# Patient Record
Sex: Female | Born: 1935 | Race: White | Hispanic: No | Marital: Married | Smoking: Never smoker
Health system: Southern US, Community
[De-identification: ages and names within clinical notes are randomized; demographics above are authoritative.]

## PROBLEM LIST (undated history)

## (undated) DIAGNOSIS — I219 Acute myocardial infarction, unspecified: Secondary | ICD-10-CM

## (undated) DIAGNOSIS — I10 Essential (primary) hypertension: Secondary | ICD-10-CM

## (undated) DIAGNOSIS — E785 Hyperlipidemia, unspecified: Secondary | ICD-10-CM

## (undated) DIAGNOSIS — F0390 Unspecified dementia without behavioral disturbance: Secondary | ICD-10-CM

## (undated) DIAGNOSIS — E079 Disorder of thyroid, unspecified: Secondary | ICD-10-CM

## (undated) HISTORY — DX: Acute myocardial infarction, unspecified: I21.9

## (undated) HISTORY — DX: Essential (primary) hypertension: I10

## (undated) HISTORY — DX: Disorder of thyroid, unspecified: E07.9

## (undated) HISTORY — DX: Unspecified dementia, unspecified severity, without behavioral disturbance, psychotic disturbance, mood disturbance, and anxiety: F03.90

## (undated) HISTORY — PX: NO PAST SURGERIES: SHX2092

## (undated) HISTORY — DX: Hyperlipidemia, unspecified: E78.5

---

## 1997-11-26 ENCOUNTER — Other Ambulatory Visit: Admission: RE | Admit: 1997-11-26 | Discharge: 1997-11-26 | Payer: Self-pay | Admitting: *Deleted

## 1998-02-09 ENCOUNTER — Other Ambulatory Visit: Admission: RE | Admit: 1998-02-09 | Discharge: 1998-02-09 | Payer: Self-pay | Admitting: Cardiology

## 1999-04-07 ENCOUNTER — Emergency Department (HOSPITAL_COMMUNITY): Admission: EM | Admit: 1999-04-07 | Discharge: 1999-04-07 | Payer: Self-pay | Admitting: Emergency Medicine

## 1999-11-19 ENCOUNTER — Encounter: Payer: Self-pay | Admitting: Cardiology

## 1999-11-19 ENCOUNTER — Encounter: Admission: RE | Admit: 1999-11-19 | Discharge: 1999-11-19 | Payer: Self-pay | Admitting: Cardiology

## 2000-03-30 ENCOUNTER — Ambulatory Visit (HOSPITAL_COMMUNITY): Admission: RE | Admit: 2000-03-30 | Discharge: 2000-03-30 | Payer: Self-pay | Admitting: *Deleted

## 2000-11-30 ENCOUNTER — Encounter: Admission: RE | Admit: 2000-11-30 | Discharge: 2000-11-30 | Payer: Self-pay | Admitting: Cardiology

## 2000-11-30 ENCOUNTER — Encounter: Payer: Self-pay | Admitting: Cardiology

## 2000-12-05 ENCOUNTER — Encounter: Admission: RE | Admit: 2000-12-05 | Discharge: 2000-12-05 | Payer: Self-pay | Admitting: Cardiology

## 2000-12-05 ENCOUNTER — Encounter: Payer: Self-pay | Admitting: Cardiology

## 2001-11-15 ENCOUNTER — Encounter: Admission: RE | Admit: 2001-11-15 | Discharge: 2001-11-15 | Payer: Self-pay | Admitting: Cardiology

## 2001-11-15 ENCOUNTER — Encounter: Payer: Self-pay | Admitting: Cardiology

## 2003-01-31 ENCOUNTER — Encounter: Admission: RE | Admit: 2003-01-31 | Discharge: 2003-01-31 | Payer: Self-pay | Admitting: Cardiology

## 2003-01-31 ENCOUNTER — Encounter: Payer: Self-pay | Admitting: Cardiology

## 2003-08-10 ENCOUNTER — Emergency Department (HOSPITAL_COMMUNITY): Admission: EM | Admit: 2003-08-10 | Discharge: 2003-08-11 | Payer: Self-pay | Admitting: Emergency Medicine

## 2004-04-23 ENCOUNTER — Encounter: Admission: RE | Admit: 2004-04-23 | Discharge: 2004-04-23 | Payer: Self-pay | Admitting: Cardiology

## 2005-06-03 ENCOUNTER — Encounter: Admission: RE | Admit: 2005-06-03 | Discharge: 2005-06-03 | Payer: Self-pay | Admitting: Cardiology

## 2005-06-14 ENCOUNTER — Encounter: Admission: RE | Admit: 2005-06-14 | Discharge: 2005-06-14 | Payer: Self-pay | Admitting: Cardiology

## 2006-03-13 ENCOUNTER — Inpatient Hospital Stay (HOSPITAL_COMMUNITY): Admission: EM | Admit: 2006-03-13 | Discharge: 2006-03-16 | Payer: Self-pay | Admitting: Emergency Medicine

## 2006-03-30 ENCOUNTER — Encounter: Admission: RE | Admit: 2006-03-30 | Discharge: 2006-03-30 | Payer: Self-pay | Admitting: Cardiology

## 2006-05-26 ENCOUNTER — Encounter: Admission: RE | Admit: 2006-05-26 | Discharge: 2006-05-26 | Payer: Self-pay | Admitting: Cardiology

## 2007-06-22 ENCOUNTER — Encounter: Admission: RE | Admit: 2007-06-22 | Discharge: 2007-06-22 | Payer: Self-pay | Admitting: Cardiology

## 2007-12-03 IMAGING — CR DG CHEST 2V
2 series · 2 of 2 positions shown · non-contrast
Comparison: none

CLINICAL DATA: Chest pain, history of hypertension. 
 CHEST - 2 VIEW:

[view not recorded (1 of 2)]
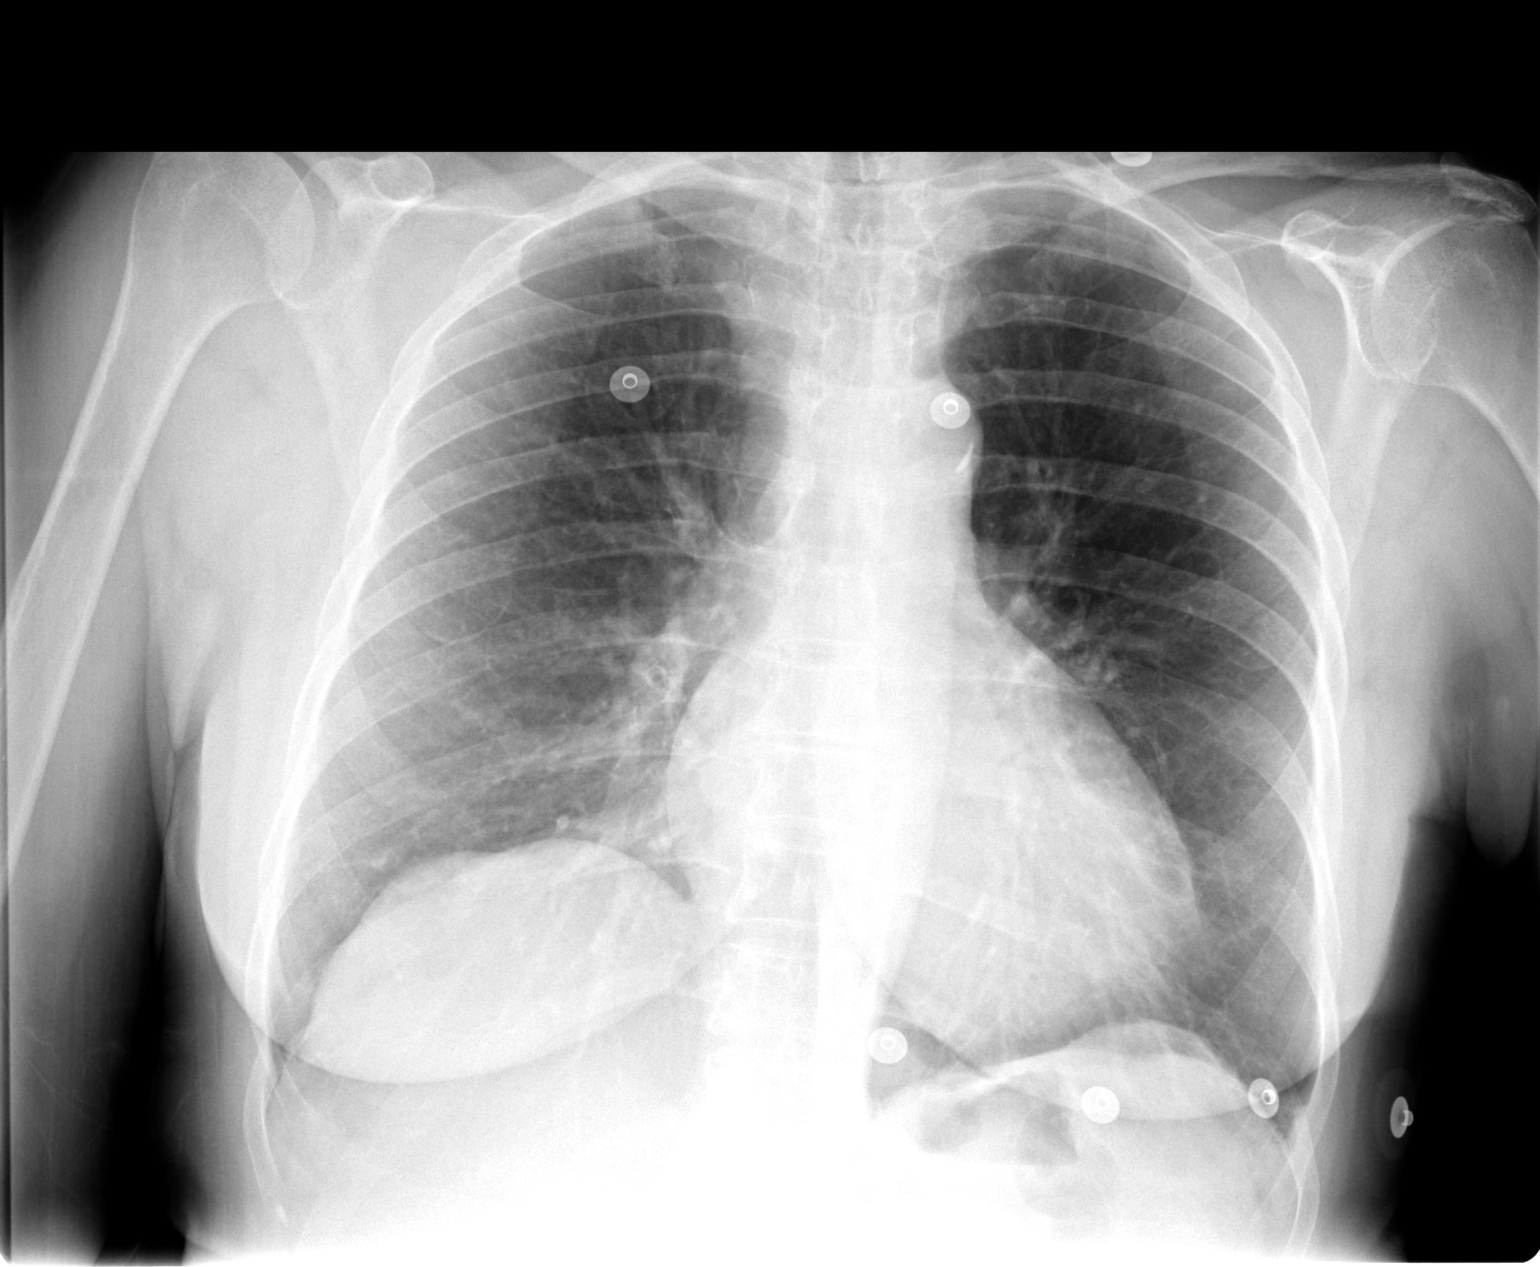

[view not recorded (2 of 2)]
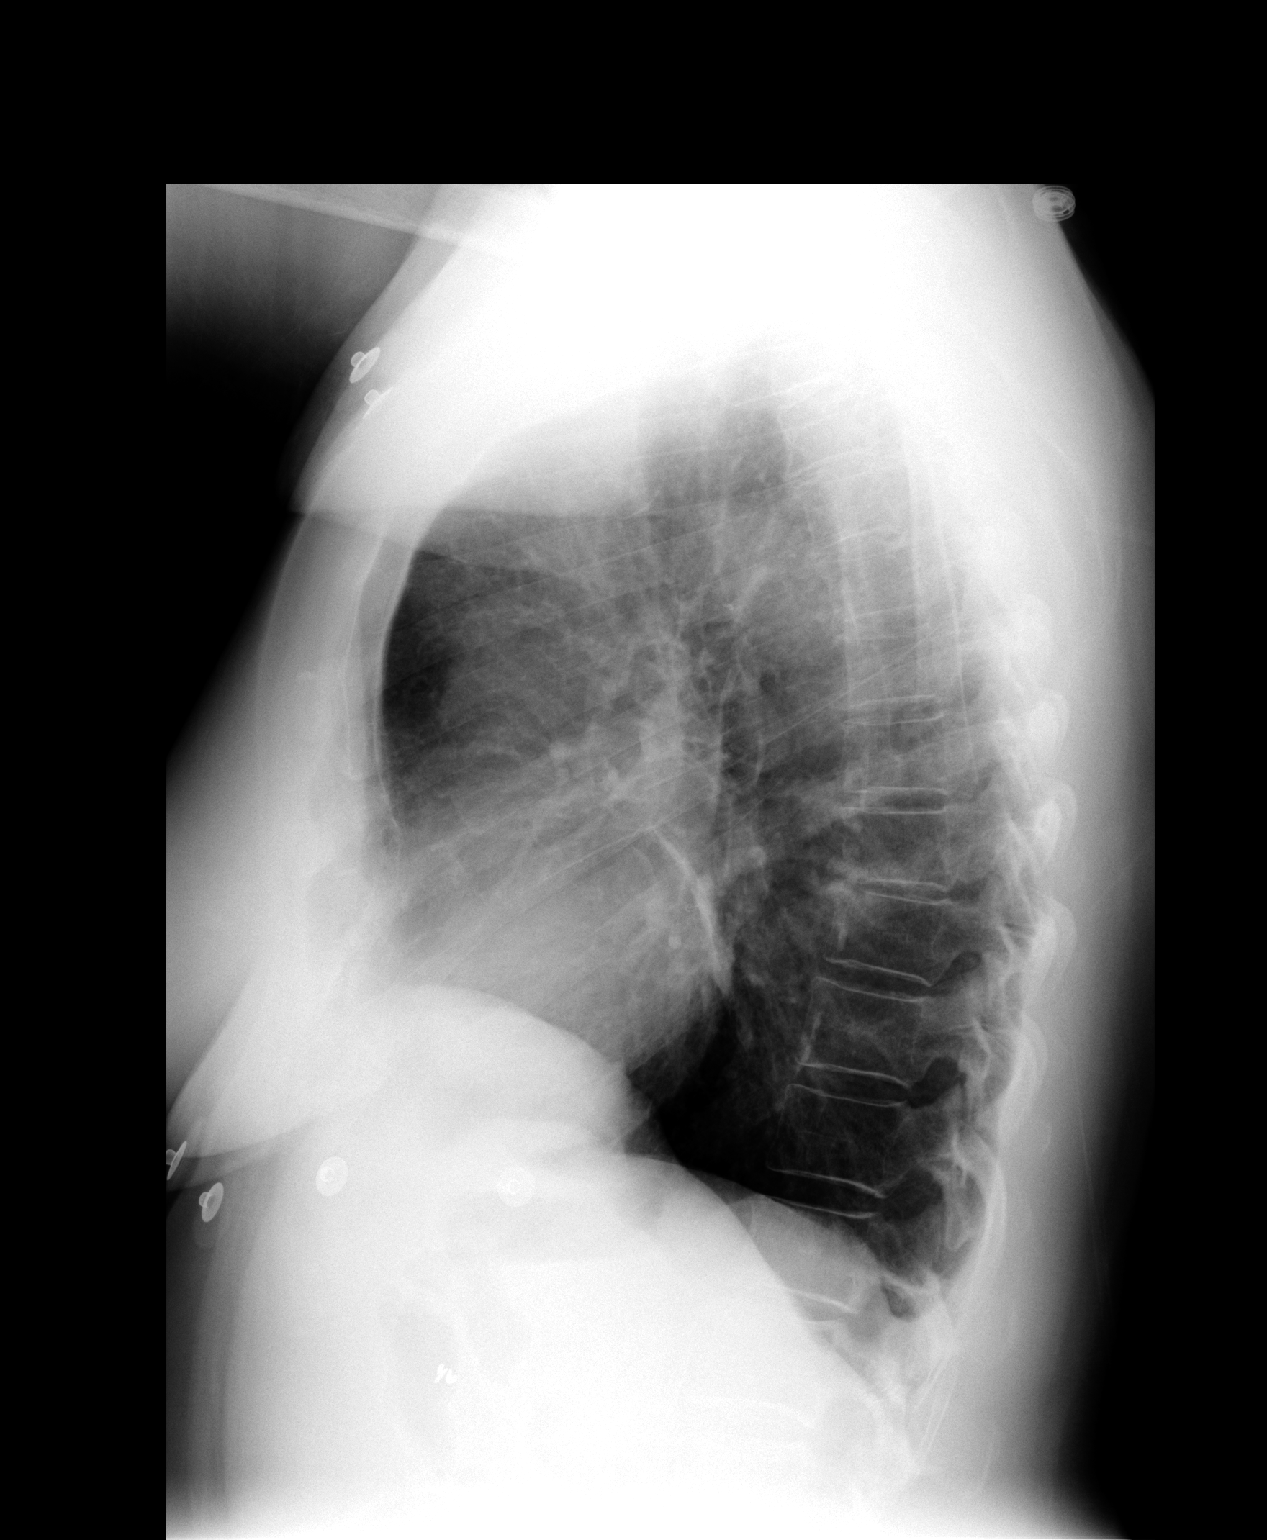

[2 of 2 positions shown; findings below may reference images not displayed]

FINDINGS: Pulmonary hyperaeration.  No active infiltrate/atelectasis.  Pectus excavatum.  Cardiac size towards the upper limits of normal.   No vascular congestion.
IMPRESSION: Pulmonary hyperaeration ? suspicion for COPD.   This could represent acute hyperaeration as this may be seen in patients with asthma.

## 2008-06-23 ENCOUNTER — Encounter: Admission: RE | Admit: 2008-06-23 | Discharge: 2008-06-23 | Payer: Self-pay | Admitting: Cardiology

## 2008-07-03 ENCOUNTER — Encounter: Admission: RE | Admit: 2008-07-03 | Discharge: 2008-07-03 | Payer: Self-pay | Admitting: Cardiology

## 2008-12-15 ENCOUNTER — Encounter: Payer: Self-pay | Admitting: Family Medicine

## 2008-12-15 ENCOUNTER — Encounter: Payer: Self-pay | Admitting: Cardiology

## 2009-09-30 ENCOUNTER — Encounter: Payer: Self-pay | Admitting: Family Medicine

## 2009-12-31 ENCOUNTER — Encounter: Payer: Self-pay | Admitting: Family Medicine

## 2009-12-31 LAB — CONVERTED CEMR LAB
Alkaline Phosphatase: 46 units/L
BUN: 9 mg/dL
Chloride, Serum: 97 mmol/L
Cholesterol: 167 mg/dL
Creatinine, Ser: 0.95 mg/dL
Globulin: 2.6 g/dL
Glucose, Bld: 111 mg/dL
HDL: 59 mg/dL
LDL Cholesterol: 83 mg/dL

## 2010-03-31 ENCOUNTER — Telehealth (INDEPENDENT_AMBULATORY_CARE_PROVIDER_SITE_OTHER): Payer: Self-pay | Admitting: *Deleted

## 2010-04-06 ENCOUNTER — Telehealth (INDEPENDENT_AMBULATORY_CARE_PROVIDER_SITE_OTHER): Payer: Self-pay | Admitting: *Deleted

## 2010-04-09 ENCOUNTER — Encounter (INDEPENDENT_AMBULATORY_CARE_PROVIDER_SITE_OTHER): Payer: Self-pay | Admitting: *Deleted

## 2010-04-09 DIAGNOSIS — E039 Hypothyroidism, unspecified: Secondary | ICD-10-CM | POA: Insufficient documentation

## 2010-04-09 DIAGNOSIS — I10 Essential (primary) hypertension: Secondary | ICD-10-CM | POA: Insufficient documentation

## 2010-04-09 DIAGNOSIS — E785 Hyperlipidemia, unspecified: Secondary | ICD-10-CM

## 2010-04-12 ENCOUNTER — Ambulatory Visit: Payer: Self-pay | Admitting: Family Medicine

## 2010-04-12 DIAGNOSIS — R5383 Other fatigue: Secondary | ICD-10-CM

## 2010-04-12 DIAGNOSIS — D485 Neoplasm of uncertain behavior of skin: Secondary | ICD-10-CM

## 2010-04-12 DIAGNOSIS — R413 Other amnesia: Secondary | ICD-10-CM

## 2010-04-12 DIAGNOSIS — R011 Cardiac murmur, unspecified: Secondary | ICD-10-CM

## 2010-04-12 DIAGNOSIS — R5381 Other malaise: Secondary | ICD-10-CM

## 2010-04-12 DIAGNOSIS — H9190 Unspecified hearing loss, unspecified ear: Secondary | ICD-10-CM | POA: Insufficient documentation

## 2010-04-19 ENCOUNTER — Encounter: Payer: Self-pay | Admitting: Family Medicine

## 2010-04-21 LAB — CONVERTED CEMR LAB
AST: 29 units/L (ref 0–37)
Albumin: 4.2 g/dL (ref 3.5–5.2)
Alkaline Phosphatase: 42 units/L (ref 39–117)
BUN: 8 mg/dL (ref 6–23)
Bilirubin, Direct: 0.3 mg/dL (ref 0.0–0.3)
CO2: 34 meq/L — ABNORMAL HIGH (ref 19–32)
Chloride: 95 meq/L — ABNORMAL LOW (ref 96–112)
Cholesterol: 193 mg/dL (ref 0–200)
Creatinine, Ser: 0.9 mg/dL (ref 0.4–1.2)
Eosinophils Absolute: 0.1 10*3/uL (ref 0.0–0.7)
Glucose, Bld: 102 mg/dL — ABNORMAL HIGH (ref 70–99)
MCHC: 33.6 g/dL (ref 30.0–36.0)
MCV: 96.3 fL (ref 78.0–100.0)
Monocytes Absolute: 0.3 10*3/uL (ref 0.1–1.0)
Neutrophils Relative %: 56.9 % (ref 43.0–77.0)
Platelets: 159 10*3/uL (ref 150.0–400.0)
Total CHOL/HDL Ratio: 4
Total Protein: 6.7 g/dL (ref 6.0–8.3)
Triglycerides: 110 mg/dL (ref 0.0–149.0)

## 2010-04-26 ENCOUNTER — Ambulatory Visit: Payer: Self-pay | Admitting: Internal Medicine

## 2010-04-26 ENCOUNTER — Ambulatory Visit (HOSPITAL_COMMUNITY): Admission: RE | Admit: 2010-04-26 | Discharge: 2010-04-26 | Payer: Self-pay | Admitting: Family Medicine

## 2010-04-26 ENCOUNTER — Ambulatory Visit: Payer: Self-pay

## 2010-04-26 ENCOUNTER — Encounter: Payer: Self-pay | Admitting: Family Medicine

## 2010-04-26 ENCOUNTER — Telehealth: Payer: Self-pay | Admitting: Family Medicine

## 2010-05-24 ENCOUNTER — Ambulatory Visit: Payer: Self-pay | Admitting: Family Medicine

## 2010-06-01 ENCOUNTER — Telehealth: Payer: Self-pay | Admitting: Family Medicine

## 2010-06-07 ENCOUNTER — Telehealth: Payer: Self-pay | Admitting: Family Medicine

## 2010-06-10 DIAGNOSIS — E871 Hypo-osmolality and hyponatremia: Secondary | ICD-10-CM | POA: Insufficient documentation

## 2010-06-10 DIAGNOSIS — R079 Chest pain, unspecified: Secondary | ICD-10-CM

## 2010-06-10 DIAGNOSIS — Z8659 Personal history of other mental and behavioral disorders: Secondary | ICD-10-CM

## 2010-06-10 DIAGNOSIS — E876 Hypokalemia: Secondary | ICD-10-CM

## 2010-06-11 ENCOUNTER — Ambulatory Visit: Payer: Self-pay | Admitting: Cardiology

## 2010-06-11 DIAGNOSIS — R9389 Abnormal findings on diagnostic imaging of other specified body structures: Secondary | ICD-10-CM | POA: Insufficient documentation

## 2010-08-03 ENCOUNTER — Telehealth (INDEPENDENT_AMBULATORY_CARE_PROVIDER_SITE_OTHER): Payer: Self-pay | Admitting: *Deleted

## 2010-08-31 NOTE — Miscellaneous (Signed)
Summary: [facility] 12-31-09  Clinical Lists Changes  Observations: Added new observation of TRIGLYC TOT: 127 mg/dL (64/40/3474 2:59) Added new observation of LDL: 83 mg/dL (56/38/7564 3:32) Added new observation of HDL: 59 mg/dL (95/18/8416 6:06) Added new observation of CHOLESTEROL: 167 mg/dL (30/16/0109 3:23) Added new observation of BILI TOTAL: 0.6 mg/dL (55/73/2202 5:42) Added new observation of ALK PHOS: 46 units/L (12/31/2009 9:16) Added new observation of SGPT (ALT): 15 units/L (12/31/2009 9:16) Added new observation of SGOT (AST): 23 units/L (12/31/2009 9:16) Added new observation of PROTEIN, TOT: 6.6 g/dL (70/62/3762 8:31) Added new observation of GLOBULIN TOT: 2.6 g/dL (51/76/1607 3:71) Added new observation of ALBUMIN: 4.0 g/dL (01/25/9484 4:62) Added new observation of CALCIUM: 9.1 mg/dL (70/35/0093 8:18) Added new observation of GLUCOSE SER: 111 mg/dL (29/93/7169 6:78) Added new observation of CREATININE: 0.95 mg/dL (93/81/0175 1:02) Added new observation of BUN: 9 mg/dL (58/52/7782 4:23) Added new observation of CO2 TOTAL: 26 mmol/L (12/31/2009 9:16) Added new observation of CHLORIDE: 97 mmol/L (12/31/2009 9:16) Added new observation of POTASSIUM: 3.8 mmol/L (12/31/2009 9:16) Added new observation of SODIUM: 138 mmol/L (12/31/2009 9:16)

## 2010-08-31 NOTE — Assessment & Plan Note (Signed)
Summary: np6/murmur/jml   CC:  dizziness.  History of Present Illness: 77 year old female for evaluation of murmur. Cardiac catheterization in August of 2007 revealed hyperdynamic LV function; ostial 20-30 Lcx and 30 RCA. echocardiogram in September of 2011 revealed  hyperdynamic LV function with near cavity obliteration.There is dynamic obstruction through the LV. With valsalva the gradient through the LV is 100 mm Hg. Wall thickness was increased in a pattern of moderate LVH. Doppler parameters are consistent with abnormal left ventricular relaxation (grade 1 diastolic dysfunction). The aortic valve is thckened, calcified with diastolic doming. Question if it is functionally bicuspid. Given hemodynamics in LV cannot accurately determine gradient through valve. Overall appears to be moderately restricted in its motion. Mild regurgitation. Patient denies dyspnea on exertion, orthopnea, PND, pedal edema, palpitations, syncope or chest pain. Note she is not very active as she apparently has significant dementia and memory problems.  \  Current Medications (verified): 1)  Lisinopril 20 Mg Tabs (Lisinopril) .... Take One Tablet Two Times A Day 2)  Synthroid 75 Mcg Tabs (Levothyroxine Sodium) .... Take One Tablet Daily-Brand Name 3)  Temazepam 30 Mg Caps (Temazepam) .... Take One Tablet At Bedtime 4)  Aspirin 81 Mg Tbec (Aspirin) .... Take 2 Tab in Am and 2 Tab in Pm 5)  Metoprolol Tartrate 50 Mg Tabs (Metoprolol Tartrate) .... Take Two Times A Day 6)  Oscal 500/200 D-3 500-200 Mg-Unit Tabs (Calcium-Vitamin D) .... Take 1 Tab in Am and 1 Tab in Pm 7)  Hydrochlorothiazide 12.5 Mg Caps (Hydrochlorothiazide) .Marland Kitchen.. 1 By Mouth Once Daily For Blood Pressure 8)  Zoloft 50 Mg Tabs (Sertraline Hcl) .... 0.5 Tab By Mouth Once Daily For 2 Weeks Then Increase To 1 By Mouth Qd  Allergies: No Known Drug Allergies  Past History:  Past Medical History: HYPERTENSION HYPERLIPIDEMIA DEMENTIA, HX OF NEOPLASM,  SKIN, UNCERTAIN BEHAVIOR  UNSPECIFIED HEARING LOSS HYPOTHYROIDISM   Past Surgical History: Reviewed history from 04/12/2010 and no changes required. unknown  Family History: breast CA-sister No premature CAD to patient's knowledge  Social History: Reviewed history from 04/12/2010 and no changes required. Married Never Smoked Alcohol use-no Drug use-no Regular exercise-no  Review of Systems       no fevers or chills, productive cough, hemoptysis, dysphasia, odynophagia, melena, hematochezia, dysuria, hematuria, rash, seizure activity, orthopnea, PND, pedal edema, claudication. Remaining systems are negative.   Vital Signs:  Patient profile:   77 year old female Height:      63 inches Weight:      144 pounds BMI:     25.60 Pulse rate:   88 / minute Resp:     16 per minute BP sitting:   140 / 80  (right arm)  Vitals Entered By: Kem Parkinson (June 11, 2010 12:05 PM)  Physical Exam  General:  Well developed/well nourished in NAD Skin warm/dry Patient not depressed No peripheral clubbing Back-normal HEENT-normal/normal eyelids Neck supple/normal carotid upstroke bilaterally; no bruits; no JVD; no thyromegaly chest - CTA/ normal expansion CV - RRR/normal S1 and S2; no murmurs, rubs or gallops;  PMI nondisplaced Abdomen -NT/ND, no HSM, no mass, + bowel sounds, no bruit 2+ femoral pulses, no bruits Ext-no edema, chords, 2+ DP Neuro-grossly nonfocal     EKG  Procedure date:  06/11/2010  Findings:      Sinus rhythm at a rate of 87. Left anterior fasicular block. Anterior and inferior infarct.  Impression & Recommendations:  Problem # 1:  HEART MURMUR, SYSTOLIC (ICD-785.2) Patient is noted to have  an LVOT gradient as well as possible aortic stenosis. However she is not having symptoms of dyspnea, chest pain or syncope. She also appears to have significant dementia which is worsening her her daughter. I therefore think we should be conservative and her  daughter is in agreement. We will continue with her beta blocker. I've asked her to contact us if she develops any of the above symptoms. Her updated medication list for this problem includes:    Lisinopril 20 Mg Tabs (Lisinopril) .Marland Kitchen... Take one tablet two times a day    Metoprolol Tartrate 50 Mg Tabs (Metoprolol tartrate) .Marland Kitchen... Take two times a day    Hydrochlorothiazide 12.5 Mg Caps (Hydrochlorothiazide) .Marland Kitchen... 1 by mouth once daily for blood pressure  Problem # 2:  ECHOCARDIOGRAM, ABNORMAL (ICD-793.2) As per #1.  Problem # 3:  HYPERTENSION (ICD-401.9) Blood pressure controlled. Continue present medications. Her updated medication list for this problem includes:    Lisinopril 20 Mg Tabs (Lisinopril) .Marland Kitchen... Take one tablet two times a day    Aspirin 81 Mg Tbec (Aspirin) .Marland Kitchen... Take 2 tab in am and 2 tab in pm    Metoprolol Tartrate 50 Mg Tabs (Metoprolol tartrate) .Marland Kitchen... Take two times a day    Hydrochlorothiazide 12.5 Mg Caps (Hydrochlorothiazide) .Marland Kitchen... 1 by mouth once daily for blood pressure  Problem # 4:  HYPERLIPIDEMIA (ICD-272.4) Management per primary care.  Problem # 5:  DEMENTIA, HX OF (ICD-V11.8)  Problem # 6:  HYPOTHYROIDISM (ICD-244.9)  Her updated medication list for this problem includes:    Synthroid 75 Mcg Tabs (Levothyroxine sodium) .Marland Kitchen... Take one tablet daily-brand name  Patient Instructions: 1)  Your physician wants you to follow-up in:6 MONTHS   You will receive a reminder letter in the mail two months in advance. If you don't receive a letter, please call our office to schedule the follow-up appointment. Prescriptions: METOPROLOL TARTRATE 50 MG TABS (METOPROLOL TARTRATE) take two times a day  #180 x 3   Entered by:   Kem Parkinson   Authorized by:   Ferman Hamming, MD, [facility]   Signed by:   Kem Parkinson on 06/11/2010   Method used:   [facility] [phone][facility] (retail[facility]       [address]       [place], [place]  [address]        Ph: [phone] or [phone]       Fax: [phone]   RxID:   [id number] METOPROLOL TARTRATE 50 MG TABS (METOPROLOL TARTRATE) take two times a day  #60 x 12   Entered by:   Kem Parkinson   Authorized by:   Ferman Hamming, MD, [facility]   Signed by:   Kem Parkinson on 06/11/2010   Method used:   [facility] to        [facility] [phone]* (retail)       [address]       [place], [place]  [address]       Ph: [phone] or [phone]       Fax: [phone]   RxID:   [id number]

## 2010-08-31 NOTE — Progress Notes (Signed)
Summary: FYI from Daughter  Phone Note Call from Patient Call back at [phone]   Caller: Daughter  ~ Cordelia Pen Reason for Call: Talk to Doctor Summary of Call: Patient's daughter called and wanted to leave some information to be reviewed before mom comes in for her appt. Patient has undiagnosed dementia per daughter. She has not been herself recently, she is very nasty towards her husband and seems to sleep all the time. She is very secretive and claims it is all old age. Normally she likes for one of her daughters to come to appts with her, but know wants to be by herself. Patient would like to be present during the new patient appt.  Initial call taken by: Harold Barban,  April 06, 2010 10:56 AM  Follow-up for Phone Call        will need family present to obtain hx and other necessary information. Follow-up by: Neena Rhymes MD,  April 06, 2010 11:18 AM  Additional Follow-up for Phone Call Additional follow up Details #1::        Patient's daughter is aware.  Additional Follow-up by: Harold Barban,  April 06, 2010 11:21 AM

## 2010-08-31 NOTE — Progress Notes (Signed)
Summary: med change  Phone Note Refill Request Call back at [phone] Message from:  daughter karen  Refills Requested: Medication #1:  SYNTHROID 75 MCG TABS take one tablet daily- please change to generic at family's request. Pt daughter states that pt is not taking med since it has been changed to generic. Pt is unable to understand that med are the same. Pt daughter is request that med be changed back to brand name. [facility] [address]  Pt daughter would like to know if you know of any group or program that the family can attend to better help them understand their mother condition. Pls advise...........Marland KitchenFelecia Deloach CMA  June 07, 2010 8:08 AM   Caller: Daughter  Follow-up for Phone Call        please switch back to brand name synthroid.  will forward message to our referral coordinator in hopes that she knows of any alzheimer's resources for family.  would recommend pt try Hospice and see if they have a dementia support group. Follow-up by: Neena Rhymes MD,  June 07, 2010 8:40 AM  Additional Follow-up for Phone Call Additional follow up Details #1::        Pt daughter aware..........Marland KitchenFelecia Deloach CMA  June 07, 2010 8:57 AM     Additional Follow-up for Phone Call Additional follow up Details #2::    I CALLED SPOKE WITH PATIENT'S DAUGHTER PROVIDED HER INFO TO CALL & SPEAK WITH [facility] [address] [place], PH [phone], WHO MEETS W/FAMILIES & OFFERS COUNSELING & INFO ON PATIENT'S CONDITION.  ALSO PROVIDED HER WITH INFO ON A SEMINAR BEING HELD THIS WEEKEND AT [facility] ABOUT PATIENT'S CONDITION, PH [phone], OR WEBSITE OF [email or web address].  DAUGHTER WAS VERY THANKFUL FOR INFORMATION. Follow-up by: Magdalen Spatz [facility],  June 09, 2010 3:59 PM  Additional Follow-up for Phone Call Additional follow up Details #3:: Details for Additional Follow-up Action Taken: thank you! Additional Follow-up by: Neena Rhymes MD,  June 09, 2010  4:08 PM  New/Updated Medications: SYNTHROID 75 MCG TABS (LEVOTHYROXINE SODIUM) take one tablet daily-BRAND NAME [BMN] Prescriptions: SYNTHROID 75 MCG TABS (LEVOTHYROXINE SODIUM) take one tablet daily-BRAND NAME Brand medically necessary #30 x 4   Entered by:   Jeremy Johann CMA   Authorized by:   Neena Rhymes MD   Signed by:   Jeremy Johann CMA on 06/07/2010   Method used:   Faxed to ...       [facility] [address] (retail)       [address]       [place], [place]  [address]       Ph: [phone] or [phone]       Fax: [phone]   RxID:   [id number]

## 2010-08-31 NOTE — Progress Notes (Signed)
Summary: Refill--HCTZ/synthroid  Phone Note Refill Request Message from:  Fax from Pharmacy on April 26, 2010 9:15 AM  Refills Requested: Medication #1:  HYDROCHLOROTHIAZIDE 12.5 MG CP EVERY DAY FOR BLOOD PRESSURE [facility] [id number]  Initial call taken by: Okey Regal [place],  April 26, 2010 9:17 AM  Follow-up for Phone Call        Per patient daughter, Roanna Raider, she was seeing Dr. Patty Sermons and he had been giving this med. She is aware I will ask Dr. Beverely Low when she returns. Does the patient still need this med, please advise.  Daughter also notes that the patient has possibly misplaced her Synthroid, I made her aware that when pharmacy calls I can authorize early refill. Follow-up by: Lucious Groves CMA,  April 26, 2010 9:34 AM  Additional Follow-up for Phone Call Additional follow up Details #1::        ok to refill HCTZ, #30, 6 refills  ok for synthroid for the same amount Additional Follow-up by: Neena Rhymes MD,  April 28, 2010 9:08 AM    New/Updated Medications: HYDROCHLOROTHIAZIDE 12.5 MG CAPS (HYDROCHLOROTHIAZIDE) 1 by mouth once daily for blood pressure Prescriptions: HYDROCHLOROTHIAZIDE 12.5 MG CAPS (HYDROCHLOROTHIAZIDE) 1 by mouth once daily for blood pressure  #30 x 6   Entered by:   Lucious Groves CMA   Authorized by:   Neena Rhymes MD   Signed by:   Lucious Groves CMA on 04/28/2010   Method used:   [facility]ron[facility] to        [facility] [phone][facility] (retail[facility]       [address]       [place], [place]  [address]       Ph: [phone] or [phone]       Fax: [phone]   RxID:   [id number] SYNTHROID 75 MCG TABS (LEVOTHYROXINE SODIUM) take one tablet daily- please change to generic at family's request.  #30 x 6   Entered by:   Lucious Groves CMA   Authorized by:   Neena Rhymes MD   Signed by:   Lucious Groves CMA on 04/28/2010   Method used:   [facility]ronically to        [facility] [phone]* (retail)       [address]       [place], [place]   [address]       Ph: [phone] or [phone]       Fax: [phone]   RxID:   [id number]

## 2010-08-31 NOTE — Assessment & Plan Note (Signed)
Summary: rto 1 month/cbs     Vital Signs:  Patient profile:   77 year old female Height:      63 inches Weight:      146 pounds BMI:     25.96 Pulse rate:   84 / minute BP sitting:   124 / 78  (left arm)  Vitals Entered By: Doristine Devoid CMA (May 24, 2010 2:11 PM) CC: f/u- fatigue and sleeps alot    History of Present Illness: 77 yo woman here today for f/u on   1) Fatigue- daughter reports pt is tired all the time.  reports today 'i just don't feel right'.  denies dizziness.  'nothing hurts me or anything'.  'i sleep pretty good'.  will sleep through the night until 10 or 11am.  will also take naps during the day.  will go to bed around 10pm.  daughter feels temazepam may be cause of excessive sleepiness.  pt starts arguing w/ daughter that she isn't sleepy during the day and that she never complains of this.  daughter indicates mom complains daily of fatigue.  2) memory loss- not interested in w/u at this time.  'i don't have any problem'.  family disappointed in this but doesn't want to force the issue.  Current Medications (verified): 1)  Lisinopril 20 Mg Tabs (Lisinopril) .... Take One Tablet Two Times A Day 2)  Synthroid 75 Mcg Tabs (Levothyroxine Sodium) .... Take One Tablet Daily- Please Change To Generic At [facility] Request. 3)  Temazepam 30 Mg Caps (Temazepam) .... Take One Tablet At Bedtime 4)  Aspirin 81 Mg Tbec (Aspirin) .... Take 2 Tab in Am and 2 Tab in Pm 5)  Metoprolol Tartrate 50 Mg Tabs (Metoprolol Tartrate) .... Take Two Times A Day 6)  Oscal 500/200 D-3 500-200 Mg-Unit Tabs (Calcium-Vitamin D) .... Take 1 Tab in Am and 1 Tab in Pm 7)  Hydrochlorothiazide 12.5 Mg Caps (Hydrochlorothiazide) .Marland Kitchen.. 1 By Mouth Once Daily For Blood Pressure 8)  Temazepam 15 Mg Caps (Temazepam) .Marland Kitchen.. 1 Tab By Mouth Nightly For Sleep.  Allergies (verified): No Known Drug Allergies  Review of Systems      See HPI  Physical Exam  General:  Well-developed,well-nourished,in no  acute distress; alert,appropriate and cooperative throughout examination Lungs:  Normal respiratory effort, chest expands symmetrically. Lungs are clear to auscultation, no crackles or wheezes. Heart:  II-III/VI SEM, reg S1/S2 Pulses:  +2 carotid, radial, DP Extremities:  no cyanosis/edema, non pitting edema of LEs Psych:  pt would contradict herself in the same sentence.  argumentative w/ daughter- they can't agree on sxs.   Impression & Recommendations:  Problem # 1:  FATIGUE (ICD-780.79) Assessment Unchanged labs from last visit normal.  daughter reports sxs persist.  pt disagrees.  will decrease temazepam to 15 mg and see if pt is less sleepy.  pt fears decreasing meds- concerned she won't be able to sleep.  after long discussion, pt willing to try this for 1 month.  fatigue may be related to pt's apparent dementia.  will follow closely.  Problem # 2:  MEMORY LOSS (ICD-780.93) Assessment: Unchanged family still desires pt to have a work up but pt refuses at this time and family doesn't want to force the issue.  pt obviously having memory issues as she is contradicting herself in the office and is arguing w/ daughter about her sxs and complaints.  will follow and assist as pt and family allow.  Complete Medication List: 1)  Lisinopril 20 Mg Tabs (  Lisinopril) .... Take one tablet two times a day 2)  Synthroid 75 Mcg Tabs (Levothyroxine sodium) .... Take one tablet daily- please change to generic at family's request. 3)  Temazepam 30 Mg Caps (Temazepam) .... Take one tablet at bedtime 4)  Aspirin 81 Mg Tbec (Aspirin) .... Take 2 tab in am and 2 tab in pm 5)  Metoprolol Tartrate 50 Mg Tabs (Metoprolol tartrate) .... Take two times a day 6)  Oscal 500/200 D-3 500-200 Mg-unit Tabs (Calcium-vitamin d) .... Take 1 tab in am and 1 tab in pm 7)  Hydrochlorothiazide 12.5 Mg Caps (Hydrochlorothiazide) .Marland Kitchen.. 1 by mouth once daily for blood pressure 8)  Temazepam 15 Mg Caps (Temazepam) .Marland Kitchen.. 1 tab by  mouth nightly for sleep.  Other Orders: Flu Vaccine 11yrs + MEDICARE PATIENTS (Z6109) Administration Flu vaccine - MCR (U0454)  Patient Instructions: 1)  Follow up in 1 month to determine if the lower dose of Temazepam is working 2)  I'll look forward to getting the results from your cardiology consultation 3)  Call with any questions or concerns 4)  Have a great week! Prescriptions: TEMAZEPAM 15 MG CAPS (TEMAZEPAM) 1 tab by mouth nightly for sleep.  #30 x 3   Entered and Authorized by:   Neena Rhymes MD   Signed by:   Neena Rhymes MD on 05/24/2010   Method used:   Print then Give to Patient   RxID:   [id number]  Flu Vaccine Consent Questions     Do you have a history of severe allergic reactions to this vaccine? no    Any prior history of allergic reactions to egg and/or gelatin? no    Do you have a sensitivity to the preservative Thimersol? no    Do you have a past history of Guillan-Barre Syndrome? no    Do you currently have an acute febrile illness? no    Have you ever had a severe reaction to latex? no    Vaccine information given and explained to patient? yes    Are you currently pregnant? no    Lot Number:[id number]   Exp Date:01/29/2011   Site Given  Right Deltoid IM nd Authorized by:   Neena Rhymes MD   Signed by:   Neena Rhymes MD on 05/24/2010   Method used:   Print then Give to Patient   RxID:   [id number]  [email or web address]    Orders Added: 1)  Flu Vaccine 45yrs + MEDICARE PATIENTS [Q2039] 2)  Administration Flu vaccine - MCR [G0008] 3)  Est. Patient Level III [41324]

## 2010-08-31 NOTE — Letter (Signed)
Summary: [facility]   Imported By: Lanelle Bal 04/23/2010 13:11:47  _____________________________________________________________________  External Attachment:    Type:   Image     Comment:   External Document

## 2010-08-31 NOTE — Progress Notes (Signed)
Summary: dementia issues  Phone Note Call from Patient Call back at Home Phone [phone]   Caller: Daughter--Sherri Summary of Call: Patient daughter called to find out if there is anything that can be recommended for the patient other than seeing neuro. Possibly a medication or "happy pill"? They are trying to help with pt mood/memory. They would also like recommendations about how to deal with all of this, stating that she is very ugly to her spouse, but not that bad with her daughters "not yet anyway". Please advise. Initial call taken by: Lucious Groves CMA,  June 01, 2010 11:33 AM  Follow-up for Phone Call        can start Zoloft 50mg - 1/2 pill nightly x2 weeks and then increase to 1 pill nightly.  can take 4+ weeks for med to completely take effect.  need f/u appt w/ pt in 1 month to see how meds are working. Follow-up by: Neena Rhymes MD,  June 01, 2010 11:43 AM  Additional Follow-up for Phone Call Additional follow up Details #1::        Patient daughter notified and will just keep patient appt already scheduled for 11/29. Additional Follow-up by: Lucious Groves CMA,  June 01, 2010 11:56 AM    New/Updated Medications: ZOLOFT 50 MG TABS (SERTRALINE HCL) 0.5 tab by mouth once daily for 2 weeks then increase to 1 by mouth qd Prescriptions: ZOLOFT 50 MG TABS (SERTRALINE HCL) 0.5 tab by mouth once daily for 2 weeks then increase to 1 by mouth qd  #30 x 0   Entered by:   Lucious Groves CMA   Authorized by:   Neena Rhymes MD   Signed by:   Lucious Groves CMA on 06/01/2010   Method used:   [facility]        [facility] [phone][facility] (retail)       [address]       [place], [place]  [address]       Ph: [phone] or [phone]       Fax: [phone]   RxID:   [id number]

## 2010-08-31 NOTE — Progress Notes (Signed)
Summary: RX  Phone Note Call from Patient Call back at [phone]   Caller: Daughter Reason for Call: Refill Medication Summary of Call: SHERRY HER DAUGHTER CALL FOR HER MOTHER FOR A REFILL ON LISINPRIL 20 MG [facility] [address] 1 TABLET TWICE A DAY. PT DAUGHTER STATED THAT THE PHARMACIES WAS TO FAX TO [place]. WE NEVER RECIEVE. APPT IS ON THE 9-12. Initial call taken by: Freddy Jaksch,  March 31, 2010 1:40 PM  Follow-up for Phone Call        spoke w/ patient daughter says that they have already requested records transferred which is why she wanted to get refill from our office informed prescription to be called into pharmacy....Marland KitchenMarland KitchenDoristine Devoid CMA  March 31, 2010 3:23 PM     New/Updated Medications: LISINOPRIL 20 MG TABS (LISINOPRIL) take one tablet two times a day Prescriptions: LISINOPRIL 20 MG TABS (LISINOPRIL) take one tablet two times a day  #60 x 0   Entered by:   Doristine Devoid CMA   Authorized by:   Neena Rhymes MD   Signed by:   Doristine Devoid CMA on 03/31/2010   Method used:   Electronically to        [facility] [phone]* (retail)       [address]       [place], [place]  [address]       Ph: [phone] or [phone]       Fax: [phone]   RxID:   [id number]

## 2010-08-31 NOTE — Assessment & Plan Note (Signed)
Summary: new to estab/cbs   Vital Signs:  Patient profile:   77 year old female Height:      63 inches Weight:      144 pounds Temp:     97.9 degrees F oral Pulse rate:   82 / minute Resp:     18 per minute BP sitting:   120 / 70  (left arm)  Vitals Entered By: Jeremy Johann CMA (April 12, 2010 10:12 AM) CC: new to establish, discuss meds, fatigue, forgetful, moody   History of Present Illness: 77 yo woman here today to establish care.  previously saw Dr Patty Sermons.  1) HTN- well controlled today.  On lisinopril, metoprolol, HCTZ.  no CP, SOB, HAs, visual changes, edema.  2) Hyperlipidemia- not on meds.  she reports healthy diet  3) Hypothyroid- on Synthroid.  family would like meds switched to generic.  denies heat/cold intolerance but family reports persistant fatigue (see below)  4) Fatigue- 'i sleep a lot'.  denies sadness.  daughter reports pt has been mean to husband and other family members.  daughter also reports 'she takes too much temazepam'.  daughter reports she gets her meds confused but won't admit to this.  5) Hearing loss- family has concerns, pt does not.  pt having difficult time hearing even loud conversation in exam room.  health maintainence- mammogram last year, thinks she has had colonoscopy in the past but isn't sure if or when.  last pap 'a long time ago'.  Preventive Screening-Counseling & Management  Alcohol-Tobacco     Smoking Status: never  Caffeine-Diet-Exercise     Does Patient Exercise: no      Drug Use:  no.    Current Medications (verified): 1)  Lisinopril 20 Mg Tabs (Lisinopril) .... Take One Tablet Two Times A Day 2)  Synthroid 75 Mcg Tabs (Levothyroxine Sodium) .... Take One Tablet Daily- Please Change To Generic At [facility] Request. 3)  Temazepam 30 Mg Caps (Temazepam) .... Take One Tablet At Bedtime 4)  Aspirin 81 Mg Tbec (Aspirin) .... Take 2 Tab in Am and 2 Tab in Pm 5)  Metoprolol Tartrate 50 Mg Tabs (Metoprolol Tartrate)  .... Take Two Times A Day 6)  Oscal 500/200 D-3 500-200 Mg-Unit Tabs (Calcium-Vitamin D) .... Take 1 Tab in Am and 1 Tab in Pm  Allergies (verified): No Known Drug Allergies  Past History:  Past Surgical History: unknown  Family History: breast CA-sister  Social History: Married Never Smoked Alcohol use-no Drug use-no Regular exercise-no Smoking Status:  never Drug Use:  no Does Patient Exercise:  no  Review of Systems      See HPI  Physical Exam  General:  Well-developed,well-nourished,in no acute distress; alert,appropriate and cooperative throughout examination Head:  NCAT Eyes:  PERRL, EOMI Ears:  hyperpigmented exophytic skin lesion in R ear very hard of hearing Nose:  External nasal examination shows no deformity or inflammation. Nasal mucosa are pink and moist without lesions or exudates. Mouth:  Oral mucosa and oropharynx without lesions or exudates.  Neck:  No deformities, masses, or tenderness noted. Lungs:  Normal respiratory effort, chest expands symmetrically. Lungs are clear to auscultation, no crackles or wheezes. Heart:  II-III/VI SEM, reg S1/S2 Abdomen:  soft, NT/ND, +BS Pulses:  +2 carotid, radial, DP Extremities:  no cyanosis/edema, non pitting edema of LEs Neurologic:  cranial nerves II-XII intact, gait normal, and DTRs symmetrical and normal.   Cervical Nodes:  No lymphadenopathy noted Psych:  normally interactive, good eye contact, not anxious  appearing, and not depressed appearing.     Impression & Recommendations:  Problem # 1:  HYPERTENSION (ICD-401.9) Assessment New BP well controlled.  asymptomatic.  no med changes at this time. The following medications were removed from the medication list:    Lisinopril 20 Mg Tabs (Lisinopril) .Marland Kitchen... Take two times a day Her updated medication list for this problem includes:    Lisinopril 20 Mg Tabs (Lisinopril) .Marland Kitchen... Take one tablet two times a day    Metoprolol Tartrate 50 Mg Tabs (Metoprolol  tartrate) .Marland Kitchen... Take two times a day  Orders: Venipuncture (18841) TLB-BMP (Basic Metabolic Panel-BMET) (80048-METABOL)  Problem # 2:  HYPERLIPIDEMIA (ICD-272.4) Assessment: New not currently on meds, check labs. Orders: TLB-Lipid Panel (80061-LIPID) TLB-Hepatic/Liver Function Pnl (80076-HEPATIC)  Problem # 3:  HYPOTHYROIDISM (ICD-244.9) Assessment: New family would like med switched to generic due to cost.  TSH level may factor into fatigue.  adjust meds as needed. Her updated medication list for this problem includes:    Synthroid 75 Mcg Tabs (Levothyroxine sodium) .Marland Kitchen... Take one tablet daily- please change to generic at family's request.  Orders: Specimen Handling (66063) TLB-TSH (Thyroid Stimulating Hormone) [id number]) Prescription Created Electronically [phone])  Problem # 4:  FATIGUE (ICD-780.79) Assessment: New likely multifactorial- temazepam, ? depression/dementia, hypothyroid.  check labs.  get ECHO to r/o symptomatic murmur.  will follow closely. Orders: Specimen Handling (32202) Echo Referral (Echo) TLB-CBC Platelet - w/Differential (85025-CBCD)  Problem # 5:  UNSPECIFIED HEARING LOSS (ICD-389.9) Assessment: New pt very hard of hearing in exam room today.  family is concerned about this but pt is not interested in eval.  will follow.  Problem # 6:  MEMORY LOSS (ICD-780.93) Assessment: New pt not concerned by this but family is- family feels pt is demented.  discussed neuro evaluation but pt not willing at this time.  daughter would like pt to go but realizes she can't force her.  will do mini-mental status at future visit.  Problem # 7:  HEART MURMUR, SYSTOLIC (ICD-785.2) Assessment: New unclear if this is new for pt or not.  given pt's fatigue will get ECHO to assess. Orders: Echo Referral (Echo)  Problem # 8:  NEOPLASM, SKIN, UNCERTAIN BEHAVIOR (ICD-238.2) Assessment: New pt w/ large exophytic lesion in R ear, refer to derm. Orders: Specimen Handling  (99000) Dermatology Referral (Derma)  Complete Medication List: 1)  Lisinopril 20 Mg Tabs (Lisinopril) .... Take one tablet two times a day 2)  Synthroid 75 Mcg Tabs (Levothyroxine sodium) .... Take one tablet daily- please change to generic at family's request. 3)  Temazepam 30 Mg Caps (Temazepam) .... Take one tablet at bedtime 4)  Aspirin 81 Mg Tbec (Aspirin) .... Take 2 tab in am and 2 tab in pm 5)  Metoprolol Tartrate 50 Mg Tabs (Metoprolol tartrate) .... Take two times a day 6)  Oscal 500/200 D-3 500-200 Mg-unit Tabs (Calcium-vitamin d) .... Take 1 tab in am and 1 tab in pm  Patient Instructions: 1)  Follow up in 1 month, sooner if needed 2)  Please call and let me know about the Neurology (memory) appt 3)  We'll notify you of your lab results 4)  Someone will call you with your dermatology and cardiology appts (for the murmur) 5)  Call with any questions or concerns 6)  Welcome!  We're glad to have you! Prescriptions: SYNTHROID 75 MCG TABS (LEVOTHYROXINE SODIUM) take one tablet daily- please change to generic at family's request.  #30 x 3   Entered and Authorized by:  Neena Rhymes MD   Signed by:   Neena Rhymes MD on 04/12/2010   Method used:   [facility]ron[facility] to        [phone][facility] (retail)       [address]       [place], [place]  [address]       Ph: [phone] or [phone]       Fax: [phone]   RxID:   [id number]

## 2010-08-31 NOTE — Miscellaneous (Signed)
  Clinical Lists Changes  Problems: Added new problem of HYPERLIPIDEMIA (ICD-272.4) Added new problem of HYPERTENSION (ICD-401.9) Added new problem of HYPOTHYROIDISM (ICD-244.9) Medications: Added new medication of SYNTHROID 75 MCG TABS (LEVOTHYROXINE SODIUM) take one tablet daily Added new medication of TEMAZEPAM 30 MG CAPS (TEMAZEPAM) take one tablet at bedtime Added new medication of ASPIRIN 81 MG TBEC (ASPIRIN) take one tablet daily Added new medication of KLOR-CON M20 20 MEQ CR-TABS (POTASSIUM CHLORIDE CRYS CR) take one half tablet two times a day Added new medication of METOPROLOL TARTRATE 50 MG TABS (METOPROLOL TARTRATE) take two times a day Added new medication of LISINOPRIL 20 MG TABS (LISINOPRIL) take two times a day Added new medication of FISH OIL  OIL (FISH OIL) take 1 or 2 tablets daily Observations: Added new observation of PAST MED HX: Hyperlipidemia Hypertension Hypothyroidism  (04/09/2010 8:36) Added new observation of HYPOTHYRDHX: yes (04/09/2010 8:36) Added new observation of HX OF HTN: yes (04/09/2010 8:36) Added new observation of HYPRLIPIDMIA: yes (04/09/2010 8:36)      Past Medical History:    Hyperlipidemia    Hypertension    Hypothyroidism   Appended Document:     Clinical Lists Changes  Observations: Added new observation of TRIGLYC TOT: 127 mg/dL (24/40/1027 2:53) Added new observation of LDL: 83 mg/dL (66/44/0347 4:25) Added new observation of HDL: 59 mg/dL (95/63/8756 4:33) Added new observation of CHOLESTEROL: 167 mg/dL (29/51/8841 6:60) Added new observation of BILI TOTAL: 0.6 mg/dL (63/08/6008 9:32) Added new observation of ALK PHOS: 46 units/L (12/31/2009 9:07) Added new observation of SGPT (ALT): 15 units/L (12/31/2009 9:07) Added new observation of SGOT (AST): 23 units/L (12/31/2009 9:07) Added new observation of PROTEIN, TOT: 6.6 g/dL (35/57/3220 2:54) Added new observation of GLOBULIN TOT: 2.6 g/dL (27/12/2374 2:83) Added new  observation of ALBUMIN: 4.0 g/dL (15/17/6160 7:37) Added new observation of CALCIUM: 9.1 mg/dL (10/62/6948 5:46) Added new observation of GLUCOSE SER: 111 mg/dL (27/09/5007 3:81) Added new observation of CREATININE: 0.95 mg/dL (82/99/3716 9:67) Added new observation of BUN: 9 mg/dL (89/38/1017 5:10) Added new observation of CO2 TOTAL: 26 mmol/L (12/31/2009 9:07) Added new observation of CHLORIDE: 97 mmol/L (12/31/2009 9:07) Added new observation of POTASSIUM: 3.8 mmol/L (12/31/2009 9:07) Added new observation of SODIUM: 138 mmol/L (12/31/2009 9:07) Added new observation of TSH: 2.730 microintl units/mL (09/30/2009 9:07)

## 2010-08-31 NOTE — Miscellaneous (Signed)
Summary: Appointment Canceled  Appointment status changed to canceled by [employer] on 04/16/2010 11:02 AM.  Cancellation Comments --------------------- echo/systolic murmur  Appointment Information ----------------------- Appt Type:  CARDIOLOGY ANCILLARY VISIT      Date:  Tuesday, April 20, 2010      Time:  8:30 AM for 60 min   Urgency:  Routine   Made By:  Pearson Grippe  To Visit:  [id number]    Reason:  echo/systolic murmur  Appt Comments ------------- -- 04/16/10 11:02: (CEM[facility]) CANCELED -- echo/systolic murmur -- 04/12/10 16:22: (CEM[facility]) BOOKED -- Routine CARDIOLOGY ANCILLARY VISIT at 04/20/2010 8:30 AM for 60 min echo/systolic murmur

## 2010-09-02 NOTE — Progress Notes (Signed)
Summary: needs to ask questions about Mom's health  Phone Note Call from Patient Call back at Home Phone [phone]   Caller: daughter [name] Summary of Call: called because she is concerned about her Mom and wants to discuss "issues" with the nurse--that is all she would say---please call her at [phone] Initial call taken by: Jerolyn Shin,  August 03, 2010 11:01 AM  Follow-up for Phone Call        Pt daughter states that Pt is having a hard time realizing that med are the the same so a new RX needs to be [facility]  [facility] [address]  for SYNTHROID 75 MCG TABS  not the generic.....Marland KitchenMarland KitchenFelecia Deloach CMA  August 03, 2010 1:11 PM   Spoke with daughter and advise her that Rx was changed back on 06-07-10 to brand name so Pt should have refills on file for med. Pt daughter ok but advise to contact office if for some reason pharmacy is unable to fill med....Marland KitchenMarland KitchenFelecia Deloach CMA  August 03, 2010 2:44 PM

## 2010-10-07 ENCOUNTER — Telehealth: Payer: Self-pay | Admitting: Family Medicine

## 2010-10-11 ENCOUNTER — Telehealth: Payer: Self-pay | Admitting: Family Medicine

## 2010-10-12 NOTE — Progress Notes (Signed)
Summary: Refill  Phone Note Refill Request Call back at Home Phone [phone] Message from:  Patient daughter (sherry  Refills Requested: Medication #1:  TEMAZEPAM 30 MG CAPS take one tablet at bedtime Pt daughter states that Pt tends to take more of med then she needs to.  Pt daughter states that Pt has inform her that she sometimes throughout the night wake up and takes another tab. Pt daughter notes that Pt has dementia. Pt daughter is suggestion Patient be put on placebo. Pt daughter advise that she may need to set up pill case weekly for patient and take all pill bottle away from Pt to ensure Pt is only taking med as directed. Pt daughter also advise that Pt may need to be monitor daily to ensure med are being taking properly. Per Pt daughter neither of these are options and she has been working for month with Pt to take pills away and set up weekly case with med but Pt has been very resistant to idea. Pt has advise daughter on several occasion she know what med are and how to take them so there is no need for her to take meds and dispense them to her. Pt daughter also inform that med has not be filled by this office since last year so it is unclear how Pt is getting med, Pt daughter is to check on this and get back with [place] on who is filling med.  Pls advise...Marland KitchenMarland KitchenFelecia Deloach CMA  October 07, 2010 10:40 AM    Follow-up for Phone Call        cannot prescribe placebo pills to patients- this is not ethical.  agree with the suggestions that were given.  if this is becoming difficult for family they need to schedule appt with patient and everyone involved needs to come with her so that we can discuss this issue.  it would also be helpful to know when meds are being filled and by whom b/c then if she runs out early we will be able to prove she is taking too many Follow-up by: Neena Rhymes MD,  October 07, 2010 11:14 AM  Additional Follow-up for Phone Call Additional follow up Details #1::         Left message to call office ............Marland KitchenFelecia Deloach CMA  October 07, 2010 3:14 PM   Pt daughter states Pt has been getting med filled by Dr Patty Sermons who was Pt prior PCP. Pt is currently out of med and need refill. [facility] [address]. Pls advise...........Marland KitchenFelecia Deloach CMA  October 08, 2010 11:35 AM  Pt daughter aware of above and will come in town on wednesday so she will discuss with Pt at that time..........Marland KitchenFelecia Deloach CMA  October 08, 2010 11:36 AM     Additional Follow-up for Phone Call Additional follow up Details #2::    ok for #30, 1 refill Follow-up by: Neena Rhymes MD,  October 08, 2010 12:04 PM  Prescriptions: TEMAZEPAM 30 MG CAPS (TEMAZEPAM) take one tablet at bedtime  #30 x 1   Entered by:   Jeremy Johann CMA   Authorized by:   Neena Rhymes MD   Signed by:   Jeremy Johann CMA on 10/08/2010   Method used:   Printed then faxed to ...       [phone] ([facility]       [address]       [place], [place]  [address]  Ph: [phone] or [phone]       Fax: [phone]   RxID:   [id number]

## 2010-10-19 NOTE — Progress Notes (Signed)
Summary: Refill--Lisinopril  Phone Note Refill Request Message from:  Fax from Pharmacy on October 11, 2010 1:31 PM  Refills Requested: Medication #1:  LISINOPRIL 20 MG TABS take one tablet two times a day Lonna Duval fax [phone]  Initial call taken by: Okey Regal [place],  October 11, 2010 1:32 PM  Follow-up for Phone Call        Patient did not follow up in 1 month as told in 05/2010. Please advise. Lucious Groves CMA  October 11, 2010 2:52 PM   Additional Follow-up for Phone Call Additional follow up Details #1::        ok for #30 w/ 3 refills but needs to schedule appt for either this month or next for 6 month f/u (30 minute appt) Additional Follow-up by: Neena Rhymes MD,  October 11, 2010 4:04 PM    Additional Follow-up for Phone Call Additional follow up Details #2::    Patient takes 2 daily, #60 ok? Lucious Groves CMA  October 11, 2010 4:39 PM   Additional Follow-up for Phone Call Additional follow up Details #3:: Details for Additional Follow-up Action Taken: yes, #60 w/ 3 refills Additional Follow-up by: Neena Rhymes MD,  October 12, 2010 7:55 AM  Prescriptions: LISINOPRIL 20 MG TABS (LISINOPRIL) take one tablet two times a day  #60 Tablet x 4   Entered by:   Lucious Groves CMA   Authorized by:   Neena Rhymes MD   Signed by:   Neena Rhymes MD on 10/12/2010   Method used:   [facility] [phone][facility] ([facility]       [address]       [place], [place]  [address]       Ph: [phone] or [phone]       Fax: [phone]   RxID:   [id number]  Patient daughter notified and will call back for appt. Lucious Groves CMA  October 12, 2010 8:49 AM

## 2010-11-05 ENCOUNTER — Encounter: Payer: Self-pay | Admitting: Family Medicine

## 2010-11-15 ENCOUNTER — Ambulatory Visit: Payer: Self-pay | Admitting: Family Medicine

## 2010-11-15 ENCOUNTER — Ambulatory Visit (INDEPENDENT_AMBULATORY_CARE_PROVIDER_SITE_OTHER): Payer: PRIVATE HEALTH INSURANCE | Admitting: Family Medicine

## 2010-11-15 DIAGNOSIS — R413 Other amnesia: Secondary | ICD-10-CM

## 2010-11-15 DIAGNOSIS — G4700 Insomnia, unspecified: Secondary | ICD-10-CM

## 2010-11-15 DIAGNOSIS — E039 Hypothyroidism, unspecified: Secondary | ICD-10-CM

## 2010-11-15 DIAGNOSIS — I10 Essential (primary) hypertension: Secondary | ICD-10-CM

## 2010-11-15 LAB — BASIC METABOLIC PANEL
BUN: 13 mg/dL (ref 6–23)
Calcium: 9.2 mg/dL (ref 8.4–10.5)
Creatinine, Ser: 0.9 mg/dL (ref 0.4–1.2)
GFR: 67.55 mL/min (ref >60.00)
Glucose, Bld: 96 mg/dL (ref 70–99)
Sodium: 142 mEq/L (ref 135–145)

## 2010-11-15 MED ORDER — TEMAZEPAM 30 MG PO CAPS: 30.0000 mg | ORAL_CAPSULE | Freq: Every evening | ORAL | Status: DC | PRN

## 2010-11-15 MED ORDER — LISINOPRIL-HYDROCHLOROTHIAZIDE 20-12.5 MG PO TABS: 1.0000 | ORAL_TABLET | Freq: Every day | ORAL | Status: DC

## 2010-11-15 NOTE — Progress Notes (Signed)
  Subjective:    Patient ID: [name], female    DOB: 04-21-1924, 77 y.o.   MRN: [id number]  HPI HTN- chronic problem for pt, poorly controlled today.  has been off HCTZ since December.  BP is not well controlled today.  Still taking Lisinopril and Lopressor daily.  No CP, SOB, HAs.  Mild ankle edema.  Hypothryoid- taking Synthroid daily.  Pt denies sxs of hair, nail, or skin.  Some fatigue but she attributes this to poor sleep.  Poor sleep- there was confusion w/ the Temazepam script and she hasn't been taking this.  Not sleeping well.  Daughter reports she will frequently 'nod off' during the days and isn't sure exactly how much sleep she is getting.  Pt reports difficulty staying asleep.   Review of Systems For ROS see HPI     Objective:   Physical Exam  Constitutional: She appears well-developed and well-nourished. No distress.  HENT:  Head: Normocephalic and atraumatic.  Eyes: Conjunctivae and EOM are normal. Pupils are equal, round, and reactive to light.  Neck: Normal range of motion. Neck supple. No thyromegaly present.  Cardiovascular: Normal rate and regular rhythm.   Murmur heard.      II/VI SEM heard best at USB  Pulmonary/Chest: Effort normal and breath sounds normal. No respiratory distress. She has no wheezes.  Neurological: She is alert. No cranial nerve deficit.       Memory loss noted- when pt doesn't know the answer to a question she replies- 'i don't bother myself w/ things like that'.  Skin: Skin is warm and dry.  Psychiatric: She has a normal mood and affect. Her behavior is normal.          Assessment & Plan:

## 2010-11-15 NOTE — Patient Instructions (Addendum)
Follow up in 6 months for your complete physical- do not eat before this appt STOP the Lisinopril and HCTZ START the combo pill We'll notify you of your lab results Call with any questions or concerns [place]!!!

## 2010-11-21 ENCOUNTER — Encounter: Payer: Self-pay | Admitting: Family Medicine

## 2010-11-21 DIAGNOSIS — G4700 Insomnia, unspecified: Secondary | ICD-10-CM | POA: Insufficient documentation

## 2010-11-21 NOTE — Assessment & Plan Note (Signed)
There was some confusion on her Temazepam refill and this med was never picked up.  Pt to restart although warned both pt and family of dangers of use in elderly including increased confusion and risk of falls.  They expressed understanding.

## 2010-11-21 NOTE — Assessment & Plan Note (Signed)
Pt denies this is problematic.  When daughter is in exam room w/ pt she doesn't voice her concerns but she will call and ask [place] to address this issue.  Unable to help if I am only one confronting pt w/ her deficits.  Will continue to follow.

## 2010-11-21 NOTE — Assessment & Plan Note (Signed)
Due for labs.  Pt denies sxs.  Adjust meds prn.

## 2010-11-21 NOTE — Assessment & Plan Note (Signed)
BP poorly controlled but pt has been off HCTZ since Dec.  Restart meds.  Check labs.  Currently asymptomatic.  Will follow.

## 2010-12-06 ENCOUNTER — Other Ambulatory Visit: Payer: Self-pay | Admitting: *Deleted

## 2010-12-06 MED ORDER — TEMAZEPAM 30 MG PO CAPS: 30.0000 mg | ORAL_CAPSULE | Freq: Every evening | ORAL | Status: DC | PRN

## 2010-12-06 NOTE — Telephone Encounter (Signed)
Last refilled on 4/16. Please advise.

## 2010-12-06 NOTE — Telephone Encounter (Signed)
Ok for 90 day supple to mail order if this is what family desires.  Refill x1

## 2010-12-06 NOTE — Telephone Encounter (Signed)
Pt daughter notified   

## 2010-12-17 NOTE — Cardiovascular Report (Signed)
NAMESKYA, MCCULLUM              ACCOUNT NO.:  [id number]   MEDICAL RECORD NO.:  [id number]          PATIENT TYPE:  INP   LOCATION:  2020                         FACILITY:  [facility]   PHYSICIAN:  Colleen Can. Deborah Chalk, M.D.DATE OF BIRTH:  [birth year]   DATE OF PROCEDURE:  03/15/2006  DATE OF DISCHARGE:                              CARDIAC CATHETERIZATION   PROCEDURE:  Left heart catheterization with selective coronary angiography,  left ventricular angiography.   TYPE AND SITE OF ENTRY:  Percutaneous right femoral artery with Angio-Seal.   CATHETER:  A 6-French 4 curved Judkins right and left coronary catheters, a  6-French pigtail ventriculographic catheter.   CONTRAST:  Pure Omnipaque.   MEDICATIONS:  1. Given prior procedure:  Valium 10 mg p.o.  2. Given during the procedure:  Versed 2 mg IV.   COMMENT:  The patient tolerated the procedure well.   HEMODYNAMIC DATA:  The aortic pressure was 141/66, LV was 139/14 - 22.  There is no aortic valve gradient noted on pullback.   ANGIOGRAPHIC DATA:  Left ventricular angiogram was performed in RAO  position.  The left ventricular function was hyperdynamic.  Regional wall  motion was normal.  There was no mitral regurgitation, intracardiac  calcification, or intracavitary filling defect.   CORONARY ARTERIES:  The coronary arteries arise and distribute normally.  It  is a right dominant system.   1. Left main coronary artery is normal.  2. Left anterior descending.  The left anterior descending is a reasonably      large system.  There are irregularities in the left anterior descending      after a large first diagonal vessel.  There is no significant focal      narrowing.  3. Left circumflex.  The left circumflex continues as a large obtuse      marginal.  There is 20-30% narrowing in the ostial location but      otherwise there are minimal irregularities in the left circumflex      system.  4. Right coronary artery.  The right  coronary artery is a small but      dominant system.  There is 30% narrowing in the proximal right coronary      artery but no focal obstructive disease.   OVERALL IMPRESSION:  1. Normal and somewhat hyperdynamic left ventricle.  2. Mild coronary atherosclerosis.   PLAN AND DISCUSSION:  It is felt that Ms. Mccormac probably does not have  underlying coronary ischemia.  She does have somewhat of a hyperdynamic LV  function and evaluation for diastolic function and left ventricular  hypertrophy would be appropriate.      Colleen Can. Deborah Chalk, M.D.  Electronically Signed     SNT/MEDQ  D:  03/15/2006  T:  03/15/2006  Job:  [id number]   cc:   Cassell Clement, M.D.

## 2010-12-17 NOTE — Discharge Summary (Signed)
[name], [name]              ACCOUNT NO.:  [id number]   MEDICAL RECORD NO.:  [id number]          PATIENT TYPE:  INP   LOCATION:  2020                         FACILITY:  [facility]   PHYSICIAN:  Cassell Clement, M.D. DATE OF BIRTH:  1924/06/07   DATE OF ADMISSION:  03/13/2006  DATE OF DISCHARGE:  03/16/2006                                 DISCHARGE SUMMARY   FINAL DIAGNOSES:  1. Chest pain, myocardial infarction ruled out.  2. Hyponatremia.  3. Hypokalemia.  4. Essential hypertension.  5. Dyslipidemia.  6. Hypothyroidism.  7. Probable early dementia.   OPERATIONS PERFORMED:  Left heart cardiac catheterization on March 15, 2006, by Dr. Roger Shelter, with the findings of no significant coronary  artery disease and with a hyperdynamic ventricle.   HISTORY:  This 77 year old woman was admitted by Dr. Waldon Reining on the evening  of March 13, 2006.  She had called the office late in the afternoon March 13, 2006, with a complaint that she had been experiencing nausea and  vomiting for a day and half, and she was advised to see her primary care  physician.  She did not mention any problem with chest pain at that time.  She was seen in the [facility] walk-in clinic that evening complaining of  substernal burning chest pain and was sent to [facility] emergency room were her  total CK was elevated and her CK-MB was slightly elevated, but her troponin  was normal and her EKG was nonacute; however, it was found that she had  marked electrolyte imbalance with serum sodium of 122, potassium of 2.9,  chloride 89, BUN 9, creatinine 0.8, and a blood sugar 106.  It was felt that  the hyponatremia and hypokalemia was a combination of having been vomiting  for a day and a half and also having been on long-term diuretics for her  blood pressure in the form of Diovan HCT as well as triamterene  hydrochlorothiazide.  Her diuretics were stopped and her electrolytes  gradually improved.   The patient had  had a Cardiolite stress test in 2004, which was negative for  myocardial ischemia.  She had an echocardiogram in our office on March 2007  showing moderate aortic stenosis, normal systolic function and diastolic  dysfunction.   PHYSICAL EXAMINATION:  VITAL SIGNS:  On physical examination, on admission,  her blood pressure is 153/77, pulse 69 and regular, respirations 18, O2 sat  is 95% on room air.  GENERAL:  This is an elderly white woman in no discomfort.  She was felt to  be alert, oriented, appropriate and responsive.  CARDIAC:  The cardiac exam showed no gallop.  She does have a systolic  murmur at the base, rhythm is regular.  LUNGS:  Clear.  ABDOMEN:  Negative.  EXTREMITIES:  Show thick ankles, but no pitting edema and pedal pulses were  present.   Her electrocardiogram shows a left anterior fascicular block, but no changes  of ischemia or infarction, and the chest x-ray showed no evidence of any  acute cardiopulmonary abnormality.   White count and hemoglobin were normal.  HOSPITAL COURSE:  Because of the history obtained of chest pain, the patient  was started on IV heparin and IV nitroglycerin.  She was given normal saline  and diuretics were held.  Her B-natriuretic peptide was low, consistent with  told body sodium depletion.  Her urine sodium was low at 14, consistent with  total body sodium depletion, and she did respond to IV normal saline.  Once  her electrolytes had been corrected, it was felt that we should proceed with  cardiac catheterization to determine the presence of coronary artery disease  since she did have a slight bump in her enzymes.   The cardiac catheterization was done by Dr. Deborah Chalk on March 15, 2006,  showing a dominant right coronary artery with less than 30% proximal  irregularity, a large proximal diagonal off of the LAD without significant  disease, and a left circumflex with proximal irregularities.  It is felt  that she had minimal  coronary artery disease with normal and somewhat  hyperdynamic left ventricular function.  She tolerated the procedure well.  By the following morning, her electrolytes had improved, and at the time of  discharge, her sodium is up to 136, potassium 3.9, BUN 5, creatinine 0.9,  blood sugar 81.  Her electrocardiograms remained stable showing marked left  anterior hemiblock, but no acute changes.   The patient is being discharged improved on a low-cholesterol, low-sugar,  low-salt diet.  She will be on Ecotrin 325 one daily, K-Dur 20 mEq twice a  day, generic Lopressor 50 mg twice a day, Levothroid 75 mcg daily, Os-Cal  500 milligrams twice a day, Questran half a scoop in juice daily, temazepam  30 mg h.s. p.r.n. for sleep, and we are going to start her on Aricept 5 mg  p.o. daily for problems with short-term memory.  She was instructed to stop  her Maxzide (triamterene) and stop her atenolol and her Diovan.  She will be  seen in Dr. Yevonne Pax office in 2 weeks and she is to call for an  appointment.   Condition on discharge is improved.           ______________________________  Cassell Clement, M.D.     TB/MEDQ  D:  03/16/2006  T:  03/16/2006  Job:  [id number]

## 2010-12-17 NOTE — Procedures (Signed)
[facility]  Patient:    [name], [name]                       MRN: [id number] Proc. Date: 03/30/00 Attending:  Roosvelt Harps, M.D. CC:         [name]. Patty Sermons, M.D.                           Procedure Report  PROCEDURE:  Video colonoscopy.  INDICATIONS:  77 year-old female with guaiac positive stool.  She has no GI symptoms whatsoever, and her hemoglobin is 14.8.  PREPARATION:  The patient n.p.o. since midnight having taken Phospho-Soda prep and a clear liquid diet.  The mucosa is clean throughout.  The depth of insertion to cecum.  PREPROCEDURE SEDATION:  She received 100 mg of Demerol and 10 mg of Versed intravenously.  In addition, she was on two liters of nasal cannula O2.  DESCRIPTION OF PROCEDURE:  The Olympus video colonoscope was inserted via the rectum and advanced through a very tortuous colon to the hepatic flexure. Extra abdominal pressure and rotation onto her back were required in order to reach the cecum.  Cecal landmarks were identified and photographed.  On withdrawal, the mucosa was carefully evaluated and found to be entirely normal from cecum to retroflex view of the rectum.  There were no areas of inflammation, neoplasia, or other abnormality.  No diverticula were noted. The patient tolerated the procedure well.  Pulse, blood pressure, and oximetry testing were stable throughout.  She was observed in recovery for 45 minutes and discharged home alert with a benign abdomen.  IMPRESSION:  Normal colonoscopy.  I doubt whether she has a significant problem resulting in guaiac positive stool.  RECOMMENDATIONS:  She should return to the care of Dr. Patty Sermons.  I will be glad to evaluate her and in particular, assess her upper gastrointestinal tract should she develop any symptoms or anemia or should the guaiac positivity persist, but I do not feel compelled to investigate further at this time. DD:  03/30/00 TD:   03/31/00 Job: [id number]

## 2010-12-17 NOTE — H&P (Signed)
NAME:  [name], [name] NO.:  [id number]   MEDICAL RECORD NO.:  [id number]          PATIENT TYPE:  INP   LOCATION:  1825                         FACILITY:  [facility]   PHYSICIAN:  Ulyses Amor, MD DATE OF BIRTH:  Nov 19, 1923   DATE OF ADMISSION:  03/13/2006  DATE OF DISCHARGE:                                HISTORY & PHYSICAL   [name] is an 77 year old white woman who is admitted to [facility] for further evaluation of chest pain.   The patient, who has no past history of cardiac disease, presented initially  to the emergency department with a 36-hour history of chest pain.  The chest  pain began yesterday morning.  It has been continuous since then.  The chest  pain is described as a focal ache in the mid substernal chest region.  It  does not radiate.  It is not associated with dyspnea, diaphoresis or nausea.  There are no exacerbating or ameliorating factors.  It appears not to be  related to position, activity, meals or respirations.  It has continued  unabated though in a low-grade fashion.   As noted, the patient has no past history of cardiac disease, including no  history of chest pain, myocardial infarction, or congestive heart failure.  She has a number of risk factors for coronary artery disease including  hypertension, dyslipidemia and a family history (father).  There is no clear  history of diabetes mellitus, though apparently her blood sugars have been  somewhat elevated in the past.  There is no history of smoking.   The patient's only other medical problem is that of hypothyroidism.   MEDICATIONS:  Aspirin, atenolol, calcium carbonate, Cholestyramine Light,  Diovan HCT, potassium chloride, Synthroid, triamterine/hydrochlorothiazide.   ALLERGIES:  None.   OPERATIONS:  Cholecystectomy.   SOCIAL HISTORY:  The patient neither smokes or drinks alcohol.  She lives  with her husband.  She does not work.   FAMILY HISTORY:  Heart  disease as noted above in her father.  There is no  family history of diabetes mellitus or cancer.   REVIEW OF SYSTEMS:  Reveals no problems related to her head, eyes, ears,  nose, mouth, throat, lungs, gastrointestinal system, genitourinary system or  extremities.  There is no history of neurologic or psychiatric disorder.  There is no history of fever, chills or weight loss.   PHYSICAL EXAMINATION:  VITAL SIGNS:  Blood pressure 153/77.  Pulse 69 and  regular.  Respirations 18.  Temperature 98.8.  Pulse ox 95% on room air.  GENERAL:  The patient was an elderly white woman in no discomfort.  She was  alert, oriented, appropriate and responsive.  HEENT:  Head, eyes, nose and mouth were normal.  NECK:  Without thyromegaly or adenopathy.  Carotid pulses were palpable  bilaterally and without bruits.  CARDIAC:  A normal S1 and S2.  There was no S3, S4, murmur, rub or click.  Cardiac rhythm is regular.  No chest wall tenderness was noted.  LUNGS:  Clear.  ABDOMEN:  Soft and nontender.  There was no mass, hepatosplenomegaly,  bruit,  distention, rebound, guarding or rigidity.  Bowel sounds were normal.  BREASTS/PELVIC/RECTAL:  Not performed as they were not pertinent to the  reason for acute care hospitalization.  EXTREMITIES:  Without edema, deviation or deformity.  Radial and dorsalis  pedis pulses were palpable bilaterally.  Brief screening neurologic survey  was unremarkable.   The electrocardiogram from [facility] was notable for left  anterior fascicular block.  There were no repolarization changes specific  for ischemia or infarction.   The chest radiograph, according to the radiologist, demonstrated no evidence  of acute cardiopulmonary abnormality.  COPD or asthma was suggested.  The  initial set of cardiac markers revealed a myoglobin of 316, CK-MB 3.2 and  troponin less than 0.05.  Potassium is 3.4, BUN 8, and creatinine 0.9,  sodium 122.  White count was 7.7 with a  hemoglobin of 13.8 and hematocrit of  39.8.  The remaining studies were pending at the time of this dictation.   IMPRESSION:  1. Chest pain, rule out coronary artery disease.  The patient has      experienced 36 hours of a continuous focal ache in the mid substernal      region.  2. Hyponatremia.  3. Hypertension.  4. Dyslipidemia.  5. Hypothyroidism.   PLAN:  1. Telemetry.  2. Serial cardiac enzymes.  3. Aspirin.  4. Intravenous heparin.  5. Intravenous nitroglycerin.  6. Normal saline.  7. Hold diuretics.  8. Follow BNP.  9. Further measures per Dr. Patty Sermons.   The patient's daughter was present throughout the patient's encounter.      Ulyses Amor, MD  Electronically Signed     MSC/MEDQ  D:  03/13/2006  T:  03/14/2006  Job:  [id number]   cc:   Ulyses Amor, MD  Cassell Clement, M.D.

## 2010-12-21 MED ORDER — TEMAZEPAM 30 MG PO CAPS: 30.0000 mg | ORAL_CAPSULE | Freq: Every evening | ORAL | Status: DC | PRN

## 2010-12-21 NOTE — Telephone Encounter (Signed)
Addended byCandie Echevaria on: 12/21/2010 11:44 AM   Modules accepted: Orders

## 2010-12-24 ENCOUNTER — Other Ambulatory Visit: Payer: Self-pay | Admitting: *Deleted

## 2010-12-24 MED ORDER — LISINOPRIL-HYDROCHLOROTHIAZIDE 20-12.5 MG PO TABS: 1.0000 | ORAL_TABLET | Freq: Every day | ORAL | Status: DC

## 2011-03-14 ENCOUNTER — Other Ambulatory Visit: Payer: Self-pay | Admitting: *Deleted

## 2011-03-14 MED ORDER — TEMAZEPAM 30 MG PO CAPS: 30.0000 mg | ORAL_CAPSULE | Freq: Every evening | ORAL | Status: DC | PRN

## 2011-03-14 NOTE — Telephone Encounter (Signed)
Ok for #90, no refills 

## 2011-03-14 NOTE — Telephone Encounter (Signed)
Last filled 12/21/10 with #90 and 0 rf. Last OV 11/15/10

## 2011-03-14 NOTE — Telephone Encounter (Signed)
rx faxed

## 2011-04-11 ENCOUNTER — Encounter: Payer: Self-pay | Admitting: Family Medicine

## 2011-04-11 ENCOUNTER — Telehealth: Payer: Self-pay | Admitting: *Deleted

## 2011-04-11 ENCOUNTER — Telehealth: Payer: Self-pay | Admitting: Internal Medicine

## 2011-04-11 NOTE — Telephone Encounter (Signed)
Pt needs letter to the post office stating her medical condition so that she can get her mail delivered closer to her house, pls send to karen bondurant [address] [place] [address]

## 2011-04-11 NOTE — Telephone Encounter (Signed)
Pt needs letter stating that Pt are unable to walk down driveway to mail box and that it is medically necessary for Pt to have mailbox place on house versus beside the road. Please advise   Letters needs to be mailed to [employer], [address] [place], [place] [address]

## 2011-04-11 NOTE — Telephone Encounter (Signed)
Letter mailed     KP 

## 2011-04-13 NOTE — Telephone Encounter (Signed)
Entry made about appointment and gout entered in error, wrong patient chart opened

## 2011-04-13 NOTE — Telephone Encounter (Signed)
Entry error

## 2011-04-13 NOTE — Telephone Encounter (Signed)
Going out to [place] on 9/24 and wants to be seen before then.  Did wake up with knot on side of foot that is red and painful and thinks it is gout.  We have record of three recent cancellations in system (2 for June and 1 for July) patient states she didn't do that.  Please advise

## 2011-04-14 ENCOUNTER — Encounter: Payer: Self-pay | Admitting: *Deleted

## 2011-04-14 NOTE — Telephone Encounter (Signed)
Letter done and mailed to patient 

## 2011-05-28 ENCOUNTER — Other Ambulatory Visit: Payer: Self-pay | Admitting: Family Medicine

## 2011-05-30 ENCOUNTER — Other Ambulatory Visit: Payer: Self-pay | Admitting: *Deleted

## 2011-05-30 MED ORDER — LISINOPRIL-HYDROCHLOROTHIAZIDE 20-12.5 MG PO TABS: 1.0000 | ORAL_TABLET | Freq: Every day | ORAL | Status: DC

## 2011-05-30 NOTE — Telephone Encounter (Signed)
Sent rx via e script for lisinopril added note to refill for pt to call office for cpx per has not been in office since 11-18-2010

## 2011-06-02 ENCOUNTER — Other Ambulatory Visit: Payer: Self-pay | Admitting: Family Medicine

## 2011-06-03 MED ORDER — LEVOTHYROXINE SODIUM 75 MCG PO TABS: 75.0000 ug | ORAL_TABLET | Freq: Every day | ORAL | Status: DC

## 2011-06-03 NOTE — Telephone Encounter (Signed)
rx sent to pharmacy by e-script  

## 2011-06-22 ENCOUNTER — Encounter: Payer: Self-pay | Admitting: Family Medicine

## 2011-06-27 ENCOUNTER — Telehealth: Payer: Self-pay | Admitting: *Deleted

## 2011-06-27 NOTE — Telephone Encounter (Signed)
There are meds for dementia but these do not reverse memory loss- they can only hope to prevent future loss.  Pt has been unwilling to take meds in the past.  If she is severely declining she may need placement in a memory care unit for her safety.

## 2011-06-27 NOTE — Telephone Encounter (Signed)
Pt daughter called to report that Pt is starting to sing wordless tunes all the time, and has now begun to rock back and forth continuous and memory is decreasing as well. Pt daughter would also like to see if there is a way to get Pt to take some kind of med for her condition.Pt daughter would like to get this info in prior to OV on Wednesday since she is unable to talk about issue in front of Pt.

## 2011-06-27 NOTE — Telephone Encounter (Addendum)
Discuss with patient daughter 

## 2011-06-29 ENCOUNTER — Telehealth: Payer: Self-pay | Admitting: Family Medicine

## 2011-06-29 ENCOUNTER — Ambulatory Visit (INDEPENDENT_AMBULATORY_CARE_PROVIDER_SITE_OTHER): Payer: Medicare Other | Admitting: Family Medicine

## 2011-06-29 ENCOUNTER — Encounter: Payer: Self-pay | Admitting: Family Medicine

## 2011-06-29 DIAGNOSIS — Z780 Asymptomatic menopausal state: Secondary | ICD-10-CM

## 2011-06-29 DIAGNOSIS — Z0000 Encounter for general adult medical examination without abnormal findings: Secondary | ICD-10-CM | POA: Insufficient documentation

## 2011-06-29 DIAGNOSIS — Z1231 Encounter for screening mammogram for malignant neoplasm of breast: Secondary | ICD-10-CM

## 2011-06-29 DIAGNOSIS — E785 Hyperlipidemia, unspecified: Secondary | ICD-10-CM

## 2011-06-29 DIAGNOSIS — I10 Essential (primary) hypertension: Secondary | ICD-10-CM

## 2011-06-29 DIAGNOSIS — E039 Hypothyroidism, unspecified: Secondary | ICD-10-CM

## 2011-06-29 DIAGNOSIS — Z8659 Personal history of other mental and behavioral disorders: Secondary | ICD-10-CM

## 2011-06-29 MED ORDER — DONEPEZIL HCL 5 MG PO TABS: 5.0000 mg | ORAL_TABLET | Freq: Every day | ORAL | Status: DC

## 2011-06-29 NOTE — Patient Instructions (Signed)
Follow up in 4-6 weeks to recheck memory Start the Aricept daily We'll notify you of your lab results Someone will call you with your mammogram appt Call with any questions or concerns Happy Holidays!

## 2011-06-29 NOTE — Telephone Encounter (Signed)
It is not unusual to have one side that swells more than the other.  As long as the swelling improves w/ elevation (or sleep) there is no further work up needed

## 2011-06-29 NOTE — Progress Notes (Signed)
  Subjective:    Patient ID: [name], female    DOB: Dec 08, 1923, 77 y.o.   MRN: [id number]  HPI Here today for CPE.  Risk Factors: Hyperlipidemia- chronic problem, controlling w/ diet.  No longer exercising or walking. Hypothyroid- chronic problem, on Synthroid.  Due for labs. HTN- chronic problem, well controlled on Lisinopril HCT and Lopressor. Dementia- worsening per daughter's report, not wandering.  Memory loss is not bothersome to pt. Physical Activity: no longer exercising or walking Fall Risk: low risk, steady on feet, 4 stairs at home- uses railing. Depression: no current sxs Hearing: decreased to conversational tones ADL's: independent Cognitive: pt and family report difficulty w/ memory, normal attention span.  Linear thought process. Home Safety: feels safe at home, lives w/ husband Height, Weight, BMI, Visual Acuity: see vitals, vision corrected to 20/20 w/ glasses Counseling: overdue on mammo and DEXA, no longer interested in colonoscopy or pap Labs Ordered: See A&P Care Plan: See A&P    Review of Systems Patient reports no vision changes, adenopathy, fever, weight change,  persistant/recurrent hoarseness, swallowing issues, chest pain, palpitations, edema, persistant/recurrent cough, hemoptysis, dyspnea (rest/exertional/paroxysmal nocturnal), gastrointestinal bleeding (melena, rectal bleeding), abdominal pain, significant heartburn, bowel changes, GU symptoms (dysuria, hematuria, incontinence), Gyn symptoms (abnormal  bleeding, pain),  syncope, focal weakness, numbness & tingling, skin/hair/nail changes, abnormal bruising or bleeding, anxiety, or depression.   + hearing loss, mild anorexia,     Objective:   Physical Exam General Appearance:    Alert, cooperative, no distress, appears stated age  Head:    Normocephalic, without obvious abnormality, atraumatic  Eyes:    PERRL, conjunctiva/corneas clear, EOM's intact, fundi    benign, both eyes  Ears:    Normal  TM's and external ear canals, both ears  Nose:   Nares normal, septum midline, mucosa normal, no drainage    or sinus tenderness  Throat:   Lips, mucosa, and tongue normal; teeth and gums normal  Neck:   Supple, symmetrical, trachea midline, no adenopathy;    Thyroid: no enlargement/tenderness/nodules  Back:     Symmetric, no curvature, ROM normal, no CVA tenderness  Lungs:     Clear to auscultation bilaterally, respirations unlabored  Chest Wall:    No tenderness or deformity   Heart:    Regular rate and rhythm, S1 and S2 normal, no murmur, rub   or gallop  Breast Exam:    Deferred  Abdomen:     Soft, non-tender, bowel sounds active all four quadrants,    no masses, no organomegaly  Genitalia:    Deferred  Rectal:    Extremities:   Extremities normal, atraumatic, no cyanosis or edema  Pulses:   2+ and symmetric all extremities  Skin:   Skin color, texture, turgor normal, no rashes or lesions  Lymph nodes:   Cervical, supraclavicular, and axillary nodes normal  Neurologic:   CNII-XII intact, normal strength, sensation and reflexes    throughout          Assessment & Plan:

## 2011-06-30 LAB — CBC WITH DIFFERENTIAL/PLATELET
Basophils Absolute: 0 10*3/uL (ref 0.0–0.1)
Eosinophils Absolute: 0.1 10*3/uL (ref 0.0–0.7)
Hemoglobin: 13.1 g/dL (ref 12.0–15.0)
Lymphocytes Relative: 65.5 % — ABNORMAL HIGH (ref 12.0–46.0)
MCHC: 33.7 g/dL (ref 30.0–36.0)
Monocytes Relative: 13.4 % — ABNORMAL HIGH (ref 3.0–12.0)
Neutrophils Relative %: 17.4 % — ABNORMAL LOW (ref 43.0–77.0)
Platelets: 154 10*3/uL (ref 150.0–400.0)
RDW: 15.1 % — ABNORMAL HIGH (ref 11.5–14.6)

## 2011-06-30 LAB — BASIC METABOLIC PANEL
BUN: 16 mg/dL (ref 6–23)
Calcium: 9.2 mg/dL (ref 8.4–10.5)
Creatinine, Ser: 1 mg/dL (ref 0.4–1.2)

## 2011-06-30 LAB — LIPID PANEL
Cholesterol: 179 mg/dL (ref 0–200)
LDL Cholesterol: 85 mg/dL (ref 0–99)
Triglycerides: 160 mg/dL — ABNORMAL HIGH (ref 0.0–149.0)
VLDL: 32 mg/dL (ref 0.0–40.0)

## 2011-06-30 LAB — HEPATIC FUNCTION PANEL
Bilirubin, Direct: 0.1 mg/dL (ref 0.0–0.3)
Total Bilirubin: 0.9 mg/dL (ref 0.3–1.2)

## 2011-06-30 LAB — TSH: TSH: 0.95 u[IU]/mL (ref 0.35–5.50)

## 2011-06-30 NOTE — Telephone Encounter (Signed)
Discuss with patient daughter 

## 2011-07-01 MED ORDER — POTASSIUM CHLORIDE CRYS ER 20 MEQ PO TBCR: 20.0000 meq | EXTENDED_RELEASE_TABLET | Freq: Every day | ORAL | Status: DC

## 2011-07-06 ENCOUNTER — Telehealth: Payer: Self-pay | Admitting: *Deleted

## 2011-07-06 NOTE — Telephone Encounter (Signed)
Prior Auth approved until 07-05-12, pharmacy notified via fax.

## 2011-07-10 NOTE — Assessment & Plan Note (Signed)
Chronic problem.  Check labs and adjust meds prn. 

## 2011-07-10 NOTE — Assessment & Plan Note (Signed)
Pt's PE WNL w/ exception of decreased hearing and difficulty answering questions.  Reviewed health maintenance w/ pt- not interested in colonoscopy or pap but open to DEXA and mammo.  Referral made.  Check labs.  Anticipatory guidance provided.

## 2011-07-10 NOTE — Assessment & Plan Note (Signed)
Chronic problem.  Well controlled.  Asymptomatic.  No changes. 

## 2011-07-10 NOTE — Assessment & Plan Note (Signed)
Chronic problem.  Not currently on statin.  Was previously controlling w/ diet and exercise.  No longer exercising.  Will check labs.

## 2011-07-10 NOTE — Assessment & Plan Note (Signed)
After discussion w/ pt and daughter, she is finally willing to start medication in hopes of 'preserving memory'- this is how it was phrased to pt.  Will continue to follow and titrate meds prn.

## 2011-07-16 ENCOUNTER — Other Ambulatory Visit: Payer: Self-pay | Admitting: Family Medicine

## 2011-07-18 NOTE — Telephone Encounter (Signed)
rx sent to pharmacy by e-script  

## 2011-08-01 ENCOUNTER — Telehealth: Payer: Self-pay | Admitting: *Deleted

## 2011-08-01 NOTE — Telephone Encounter (Signed)
Cordelia Pen called in per pt has been set up for a mammogram and bone density test on January 7th-12 however pt is refusing to go to this upcoming appt. Cordelia Pen wanted to verify how important this test is per pt is not wanting to go?

## 2011-08-01 NOTE — Telephone Encounter (Signed)
Would recommend these as discussed at recent physical but if pt is refusing it is not worth starting a family feud about it.

## 2011-08-03 NOTE — Telephone Encounter (Signed)
Called and spoke to sherry and advised instructions given by MD Beverely Low and sherry agreed per her mom gets so upset per demintia and she will call and cancel both appts. MD aware via verbally

## 2011-08-10 ENCOUNTER — Other Ambulatory Visit: Payer: Medicare Other

## 2011-08-10 ENCOUNTER — Ambulatory Visit: Payer: Medicare Other

## 2011-08-23 ENCOUNTER — Ambulatory Visit (INDEPENDENT_AMBULATORY_CARE_PROVIDER_SITE_OTHER): Payer: Medicare Other | Admitting: Family Medicine

## 2011-08-23 ENCOUNTER — Encounter: Payer: Self-pay | Admitting: Family Medicine

## 2011-08-23 VITALS — BP 120/75 | HR 80 | Temp 97.8°F | Ht 62.5 in | Wt 142.8 lb

## 2011-08-23 DIAGNOSIS — R413 Other amnesia: Secondary | ICD-10-CM

## 2011-08-23 MED ORDER — TEMAZEPAM 30 MG PO CAPS: 30.0000 mg | ORAL_CAPSULE | Freq: Every evening | ORAL | Status: DC | PRN

## 2011-08-23 MED ORDER — LEVOTHYROXINE SODIUM 75 MCG PO TABS: 75.0000 ug | ORAL_TABLET | Freq: Every day | ORAL | Status: DC

## 2011-08-23 MED ORDER — DONEPEZIL HCL 5 MG PO TABS: 5.0000 mg | ORAL_TABLET | Freq: Every day | ORAL | Status: DC

## 2011-08-23 NOTE — Progress Notes (Signed)
  Subjective:    Patient ID: [name], female    DOB: 01/11/1924, 78 y.o.   MRN: [id number]  HPI Memory loss- started Aricept at last visit to try and halt deterioration.  Daughter reports things are no better but don't appear to be worsening.  Pt doesn't have any opinions on the matter.  'i'm fine, thank you'.  Daughter reports pt is still eating fairly well, sleep in unchanged, no wandering.   Review of Systems For ROS see HPI     Objective:   Physical Exam  Vitals reviewed. Constitutional: She appears well-developed and well-nourished. No distress.  Neurological: She is alert. No cranial nerve deficit. Coordination normal.       Oriented to person and place, not time          Assessment & Plan:

## 2011-08-23 NOTE — Patient Instructions (Signed)
Schedule a follow up in late may to recheck cholesterol and BP Continue the Aricept daily to hold memory stable Call with any questions or concerns Happy New Year!!!

## 2011-08-29 NOTE — Assessment & Plan Note (Signed)
Unchanged.  Daughter would like to have longer trial on Aricept before determining whether it is making a difference.  Will reassess in May when pt has her 6 month f/u.

## 2011-09-19 ENCOUNTER — Telehealth: Payer: Self-pay | Admitting: Family Medicine

## 2011-09-19 NOTE — Telephone Encounter (Signed)
Should take 1 81mg  ASA daily- this is all she requires

## 2011-09-19 NOTE — Telephone Encounter (Signed)
Pt daughter states that Pt is taking as a daily regimen per previous physician, Pt daughter notes that Pt is not having any issues or pain that require Pt to take med. Please advise if ok for Pt to still take med daily without having issues.

## 2011-09-19 NOTE — Telephone Encounter (Signed)
Please call after 4pm

## 2011-09-19 NOTE — Telephone Encounter (Signed)
Discuss with patient daughter 

## 2011-09-19 NOTE — Telephone Encounter (Signed)
81 mg 4x/day is not too much but the better question is why is she taking this?  I'm assuming it's for pain but what is bothering her?

## 2011-09-19 NOTE — Telephone Encounter (Signed)
Patients daughter Clydie Braun wants nurse to call patients other daughter Cordelia Pen about this.   Clydie Braun states that patient has been taking 81mg  aspirin 4 times a day for awhile. Clydie Braun is concerned about this. She wants to know if this is too much for patient?

## 2011-11-14 ENCOUNTER — Telehealth: Payer: Self-pay | Admitting: Family Medicine

## 2011-11-14 MED ORDER — POTASSIUM CHLORIDE CRYS ER 20 MEQ PO TBCR: 20.0000 meq | EXTENDED_RELEASE_TABLET | Freq: Every day | ORAL | Status: DC

## 2011-11-14 NOTE — Telephone Encounter (Signed)
rx sent to pharmacy by e-script  

## 2011-11-14 NOTE — Telephone Encounter (Signed)
Refill: Klor-Con m20 tablet #30. Take 1 tablet by mouth every day. Last fill 10-15-11

## 2012-01-16 ENCOUNTER — Telehealth: Payer: Self-pay | Admitting: Family Medicine

## 2012-01-16 NOTE — Telephone Encounter (Signed)
Refill: Klor-Con m20 tablet. 90 day supply Metoprolol tartrate 50mg  tab. 90 day supply

## 2012-01-17 ENCOUNTER — Emergency Department (HOSPITAL_COMMUNITY): Payer: Medicare Other

## 2012-01-17 ENCOUNTER — Inpatient Hospital Stay (HOSPITAL_COMMUNITY)
Admission: EM | Admit: 2012-01-17 | Discharge: 2012-01-20 | DRG: 281 | Disposition: A | Discharge status: Home Health | Payer: Medicare Other | Attending: Internal Medicine | Admitting: Internal Medicine

## 2012-01-17 ENCOUNTER — Encounter (HOSPITAL_COMMUNITY): Payer: Self-pay | Admitting: *Deleted

## 2012-01-17 DIAGNOSIS — D649 Anemia, unspecified: Secondary | ICD-10-CM | POA: Diagnosis present

## 2012-01-17 DIAGNOSIS — E871 Hypo-osmolality and hyponatremia: Secondary | ICD-10-CM | POA: Diagnosis present

## 2012-01-17 DIAGNOSIS — F0390 Unspecified dementia without behavioral disturbance: Secondary | ICD-10-CM | POA: Diagnosis present

## 2012-01-17 DIAGNOSIS — M6281 Muscle weakness (generalized): Secondary | ICD-10-CM

## 2012-01-17 DIAGNOSIS — N390 Urinary tract infection, site not specified: Secondary | ICD-10-CM | POA: Diagnosis present

## 2012-01-17 DIAGNOSIS — I959 Hypotension, unspecified: Secondary | ICD-10-CM

## 2012-01-17 DIAGNOSIS — R079 Chest pain, unspecified: Secondary | ICD-10-CM

## 2012-01-17 DIAGNOSIS — F05 Delirium due to known physiological condition: Secondary | ICD-10-CM | POA: Diagnosis present

## 2012-01-17 DIAGNOSIS — I214 Non-ST elevation (NSTEMI) myocardial infarction: Secondary | ICD-10-CM | POA: Diagnosis present

## 2012-01-17 DIAGNOSIS — R112 Nausea with vomiting, unspecified: Secondary | ICD-10-CM

## 2012-01-17 DIAGNOSIS — N179 Acute kidney failure, unspecified: Secondary | ICD-10-CM | POA: Diagnosis present

## 2012-01-17 DIAGNOSIS — E039 Hypothyroidism, unspecified: Secondary | ICD-10-CM | POA: Diagnosis present

## 2012-01-17 LAB — CBC
HCT: 19.8 % — ABNORMAL LOW (ref 36.0–46.0)
Hemoglobin: 6 g/dL — CL (ref 12.0–15.0)
MCV: 83.9 fL (ref 78.0–100.0)
RBC: 2.36 MIL/uL — ABNORMAL LOW (ref 3.87–5.11)
WBC: 5.2 10*3/uL (ref 4.0–10.5)

## 2012-01-17 LAB — DIFFERENTIAL
Eosinophils Relative: 1 % (ref 0–5)
Lymphocytes Relative: 38 % (ref 12–46)
Lymphs Abs: 2 10*3/uL (ref 0.7–4.0)
Monocytes Absolute: 0.5 10*3/uL (ref 0.1–1.0)
Monocytes Relative: 9 % (ref 3–12)

## 2012-01-17 LAB — COMPREHENSIVE METABOLIC PANEL
ALT: 11 U/L (ref 0–35)
BUN: 25 mg/dL — ABNORMAL HIGH (ref 6–23)
CO2: 27 mEq/L (ref 19–32)
Calcium: 9.5 mg/dL (ref 8.4–10.5)
Creatinine, Ser: 1.11 mg/dL — ABNORMAL HIGH (ref 0.50–1.10)
GFR calc Af Amer: 51 mL/min — ABNORMAL LOW (ref >90)
GFR calc non Af Amer: 44 mL/min — ABNORMAL LOW (ref >90)
Glucose, Bld: 118 mg/dL — ABNORMAL HIGH (ref 70–99)
Sodium: 133 mEq/L — ABNORMAL LOW (ref 135–145)

## 2012-01-17 LAB — URINALYSIS, ROUTINE W REFLEX MICROSCOPIC
Bilirubin Urine: NEGATIVE
Nitrite: NEGATIVE
Specific Gravity, Urine: 1.022 (ref 1.005–1.030)
Urobilinogen, UA: 1 mg/dL (ref 0.0–1.0)

## 2012-01-17 LAB — URINE MICROSCOPIC-ADD ON

## 2012-01-17 LAB — PREPARE RBC (CROSSMATCH)

## 2012-01-17 MED ORDER — LORAZEPAM 2 MG/ML IJ SOLN
1.0000 mg | Freq: Four times a day (QID) | INTRAMUSCULAR | Status: DC | PRN
  Administered 2012-01-17 – 2012-01-18 (×5): 1 mg via INTRAVENOUS
  Filled 2012-01-17 (×5): qty 1

## 2012-01-17 MED ORDER — HALOPERIDOL LACTATE 5 MG/ML IJ SOLN
1.0000 mg | Freq: Four times a day (QID) | INTRAMUSCULAR | Status: DC | PRN
  Administered 2012-01-17 – 2012-01-18 (×3): 1 mg via INTRAVENOUS
  Filled 2012-01-17 (×2): qty 1

## 2012-01-17 MED ORDER — METOPROLOL TARTRATE 50 MG PO TABS: 50.0000 mg | ORAL_TABLET | Freq: Two times a day (BID) | ORAL | Status: DC

## 2012-01-17 MED ORDER — POTASSIUM CHLORIDE CRYS ER 20 MEQ PO TBCR: 20.0000 meq | EXTENDED_RELEASE_TABLET | Freq: Every day | ORAL | Status: DC

## 2012-01-17 MED ORDER — SODIUM CHLORIDE 0.9 % IJ SOLN
3.0000 mL | Freq: Two times a day (BID) | INTRAMUSCULAR | Status: DC
  Administered 2012-01-19 (×2): 3 mL via INTRAVENOUS

## 2012-01-17 MED ORDER — CALCIUM CARBONATE-VITAMIN D 500-200 MG-UNIT PO TABS
1.0000 | ORAL_TABLET | Freq: Two times a day (BID) | ORAL | Status: DC
  Administered 2012-01-18: 1 via ORAL
  Filled 2012-01-17 (×7): qty 1

## 2012-01-17 MED ORDER — PANTOPRAZOLE SODIUM 40 MG IV SOLR
40.0000 mg | Freq: Two times a day (BID) | INTRAVENOUS | Status: DC
  Administered 2012-01-18 – 2012-01-20 (×4): 40 mg via INTRAVENOUS
  Filled 2012-01-17 (×7): qty 40

## 2012-01-17 MED ORDER — SODIUM CHLORIDE 0.9 % IV SOLN
INTRAVENOUS | Status: DC
  Administered 2012-01-18: 75 mL/h via INTRAVENOUS

## 2012-01-17 MED ORDER — HALOPERIDOL LACTATE 5 MG/ML IJ SOLN
1.0000 mg | Freq: Four times a day (QID) | INTRAMUSCULAR | Status: DC | PRN
  Administered 2012-01-17: 1 mg via INTRAVENOUS
  Filled 2012-01-17 (×2): qty 1

## 2012-01-17 MED ORDER — LEVOTHYROXINE SODIUM 75 MCG PO TABS
75.0000 ug | ORAL_TABLET | Freq: Every day | ORAL | Status: DC
  Filled 2012-01-17 (×3): qty 1

## 2012-01-17 NOTE — H&P (Signed)
[facility] History and Physical  [name] ZOX:[id number] DOB: 01-21-1924 DOA: 01/17/2012  Referring physician: Dr. Rubin Payor in ED PCP: Neena Rhymes, MD   Chief Complaint: Palpitations  HPI:  Please note that history was provided by family at bedside and the details are note clear. Pt is poor historian given advance dementia. Pt reported earlier palpitations, but no chest pain or shortness of breath. It is unclear if pt has had any abdominal or urinary symptoms, no specific focal neurologic weakness was noted by family member.   Review of Systems:  Unable to obtain as pt has advanced dementia and is poor historian.    Past Medical History  Diagnosis Date  . Hyperlipidemia   . Hypertension   . Dementia   . Thyroid disease     Hypothyroidism  . Hearing loss    Past Surgical History  Procedure Date  . No past surgeries    Social History:  reports that she has never smoked. She has never used smokeless tobacco. She reports that she does not drink alcohol or use illicit drugs.  No Known Allergies  Family History  Problem Relation Age of Onset  . Cancer Sister     breast cancer    Prior to Admission medications   Medication Sig Start Date End Date Taking? Authorizing Provider  aspirin 81 MG tablet Take 81 mg by mouth 2 (two) times daily.     Historical Provider, MD  calcium-vitamin D (OSCAL WITH D) 500-200 MG-UNIT per tablet Take 1 tablet by mouth 2 (two) times daily.      Historical Provider, MD  donepezil (ARICEPT) 5 MG tablet Take 1 tablet (5 mg total) by mouth at bedtime. 08/23/11 08/22/12  Sheliah Hatch, MD  levothyroxine (SYNTHROID) 75 MCG tablet Take 1 tablet (75 mcg total) by mouth daily. Brand Name. 08/23/11   Sheliah Hatch, MD  lisinopril-hydrochlorothiazide (ZESTORETIC) 20-12.5 MG per tablet Take 1 tablet by mouth daily. 05/30/11 05/29/12  Sheliah Hatch, MD  metoprolol (LOPRESSOR) 50 MG tablet Take 1 tablet (50 mg total) by mouth 2  (two) times daily. 01/17/12   Sheliah Hatch, MD  Nutritional Supplements (MELATONIN PO) Take by mouth at bedtime.      Historical Provider, MD  potassium chloride SA (K-DUR,KLOR-CON) 20 MEQ tablet Take 1 tablet (20 mEq total) by mouth daily. 01/17/12 01/16/13  Sheliah Hatch, MD  temazepam (RESTORIL) 30 MG capsule Take 1 capsule (30 mg total) by mouth at bedtime as needed. 08/23/11   Sheliah Hatch, MD   Physical Exam: Filed Vitals:   01/17/12 1819 01/17/12 1951 01/17/12 2115 01/17/12 2155  BP:  143/82 132/78 118/62  Pulse: 76 86 80   Temp:   98.4 F (36.9 C) 98 F (36.7 C)  TempSrc:      Resp: 22 18 16 16   SpO2: 100% 98%       General:  Lying in bed, trying to get up, not in acute distress, follows some commands  Eyes: PERRLA, conjunctival pallor +  ENT: no OP erythema  Neck: supple, no thyroid enlargement  Cardiovascular: Regular rate and rhythm, no murmurs  Respiratory: Decreased breath sounds at bases but otherwise clear to auscultation bilaterally, no wheezing  Abdomen: Soft, non tender, non distended, bowel sounds present  Skin: Pale with poor turgor, dry mucous membranes  Neurologic: Grossly non focal.  Labs on Admission:  Basic Metabolic Panel:  Lab 01/17/12 8119  NA 133*  K 3.5  CL 95*  CO2  27  GLUCOSE 118*  BUN 25*  CREATININE 1.11*  CALCIUM 9.5  MG --  PHOS --   Liver Function Tests:  Lab 01/17/12 1520  AST 19  ALT 11  ALKPHOS 38*  BILITOT 0.8  PROT 6.5  ALBUMIN 3.6   CBC:  Lab 01/17/12 1520  WBC 5.2  NEUTROABS 2.7  HGB 6.0*  HCT 19.8*  MCV 83.9  PLT 228   Cardiac Enzymes:  Lab 01/17/12 1520  CKTOTAL --  CKMB --  CKMBINDEX --  TROPONINI <0.30   Radiological Exams on Admission:  Dg Chest 2 View 01/17/2012    IMPRESSION: Cardiomegaly. No acute cardiopulmonary abnormality.    EKG: NSR with no acute S/T wave changes  Assessment/Plan  Palpitations - unclear etiology, currently in sinus rhythm but this could be  related to acute blood loss anemia - will admit the pt to telemetry floor for further evaluation and management - will obtain CE's, TSH, repeat 12 lead EKG  Acute blood loss anemia - unclear etiology as well - will obtain FOBT, anemia panel - will go ahead and transfuse 2 units PRBC - will continue Protonix - CBC in AM  Acute renal failure - secondary to acute blood loss anemia, pre renal etiology - will proceed with blood transfusion - BMP in AM - follow on strict I's and O's  Hyponatremia - also pre renal and likely secondary to acute blood loss - will obtain BMP in AM   Code Status: Will address in AM Family Communication: Family at bedside Disposition Plan: not medically stable for discharge  Debbora Presto, MD  [facility] Pager [phone]  If 7PM-7AM, please contact night-coverage [email or web address] Password [email or web address] 01/17/2012, 10:14 PM

## 2012-01-17 NOTE — ED Provider Notes (Signed)
History     CSN: [id number]  Arrival date & time 01/17/12  1339   First MD Initiated Contact with Patient 01/17/12 1501      Chief Complaint  Patient presents with  . Palpitations  . Anxiety   level V caveat due to dementia  (Consider location/radiation/quality/duration/timing/severity/associated sxs/prior treatment) Patient is a 78 y.o. female presenting with palpitations and anxiety. The history is provided by the patient and a relative.  Palpitations  This is a new problem.  Anxiety   patient reportedly felt like her chest is something. She was reportedly dizzy. Patient is reportedly been anxious. Family states they switched from her Restoril to melatonin that she may have been taking more of it. She's poor historian.  Past Medical History  Diagnosis Date  . Hyperlipidemia   . Hypertension   . Dementia   . Thyroid disease     Hypothyroidism  . Hearing loss     Past Surgical History  Procedure Date  . No past surgeries     Family History  Problem Relation Age of Onset  . Cancer Sister     breast cancer    History  Substance Use Topics  . Smoking status: Never Smoker   . Smokeless tobacco: Never Used  . Alcohol Use: No    OB History    Grav Para Term Preterm Abortions TAB SAB Ect Mult Living                  Review of Systems  Unable to perform ROS: Dementia  Cardiovascular: Positive for palpitations.    Allergies  Review of patient's allergies indicates no known allergies.  Home Medications   Current Outpatient Rx  Name Route Sig Dispense Refill  . ASPIRIN 81 MG PO TABS Oral Take 81 mg by mouth 2 (two) times daily.     Marland Kitchen CALCIUM CARBONATE-VITAMIN D 500-200 MG-UNIT PO TABS Oral Take 1 tablet by mouth 2 (two) times daily.      . DONEPEZIL HCL 5 MG PO TABS Oral Take 1 tablet (5 mg total) by mouth at bedtime. 30 tablet 2  . LEVOTHYROXINE SODIUM 75 MCG PO TABS Oral Take 1 tablet (75 mcg total) by mouth daily. Brand Name. 30 tablet 5  .  LISINOPRIL-HYDROCHLOROTHIAZIDE 20-12.5 MG PO TABS Oral Take 1 tablet by mouth daily. 90 tablet 1    Pt needs to schedule an appointment to be seen  . METOPROLOL TARTRATE 50 MG PO TABS Oral Take 1 tablet (50 mg total) by mouth 2 (two) times daily. 180 tablet 0  . MELATONIN PO Oral Take by mouth at bedtime.      Marland Kitchen POTASSIUM CHLORIDE CRYS ER 20 MEQ PO TBCR Oral Take 1 tablet (20 mEq total) by mouth daily. 90 tablet 0  . TEMAZEPAM 30 MG PO CAPS Oral Take 1 capsule (30 mg total) by mouth at bedtime as needed. 90 capsule 0    BP 113/53  Pulse 80  Temp 98.4 F (36.9 C) (Oral)  Resp 18  SpO2 98%  Physical Exam  Nursing note and vitals reviewed. Constitutional: She is oriented to person, place, and time. She appears well-developed and well-nourished.  HENT:  Head: Normocephalic and atraumatic.  Eyes: EOM are normal. Pupils are equal, round, and reactive to light.  Neck: Normal range of motion. Neck supple.  Cardiovascular: Normal rate and regular rhythm.   Murmur heard. Pulmonary/Chest: Effort normal and breath sounds normal. No respiratory distress. She has no wheezes. She has no rales.  Abdominal: Soft. Bowel sounds are normal. She exhibits no distension. There is no tenderness. There is no rebound and no guarding.  Musculoskeletal: Normal range of motion.  Neurological: She is alert and oriented to person, place, and time. No cranial nerve deficit.  Skin: Skin is warm and dry. There is pallor.  Psychiatric: She has a normal mood and affect. Her speech is normal.    ED Course  Procedures (including critical care time)  Labs Reviewed  CBC - Abnormal; Notable for the following:    RBC 2.36 (*)     Hemoglobin 6.0 (*)     HCT 19.8 (*)     MCH 25.4 (*)     All other components within normal limits  COMPREHENSIVE METABOLIC PANEL - Abnormal; Notable for the following:    Sodium 133 (*)     Chloride 95 (*)     Glucose, Bld 118 (*)     BUN 25 (*)     Creatinine, Ser 1.11 (*)      Alkaline Phosphatase 38 (*)     GFR calc non Af Amer 44 (*)     GFR calc Af Amer 51 (*)     All other components within normal limits  DIFFERENTIAL  TROPONIN I  URINALYSIS, ROUTINE W REFLEX MICROSCOPIC   Dg Chest 2 View  01/17/2012  *RADIOLOGY REPORT*  Clinical Data: 78 year old female with chest palpitations. Hypertension.  CHEST - 2 VIEW  Comparison: 03/13/2006.  Findings: Mild to moderate cardiomegaly is increased. Other mediastinal contours are within normal limits.  Visualized tracheal air column is within normal limits.  EKG button artifact in the right apex.  The lungs are clear.  No pneumothorax or effusion. Mild eventration of the right hemidiaphragm.  Right upper quadrant surgical clips. No acute osseous abnormality identified.  IMPRESSION: Cardiomegaly. No acute cardiopulmonary abnormality.  Original Report Authenticated By: Harley Hallmark, M.D.     1. Anemia     Date: 01/17/2012  Rate: 73  Rhythm: normal sinus rhythm  QRS Axis: normal  Intervals: normal  ST/T Wave abnormalities: normal  Conduction Disutrbances:none  Narrative Interpretation: Q waves anteriorly.  Old EKG Reviewed: unchanged     MDM  Patient presents with fatigue and palpitations. She felt her heart was thumping. Laboratory was anemia with a hemoglobin of 6. Patient refused rectal exam. She'll be admitted to medicine for further evaluation.        Juliet Rude. Rubin Payor, MD 01/17/12 [phone]

## 2012-01-17 NOTE — ED Notes (Signed)
Pt is a poor historian. Daughter reports pt stated she felt like her chest was "thumping" and was dizzy on Saturday. Pt has dementia and is extremely anxious, restless, rocking back and forth and rubbing legs.

## 2012-01-17 NOTE — Telephone Encounter (Signed)
rx sent to pharmacy by e-script for a #90 day supply Letter has been mailed to pt address noted in the chart to advise they are overdue for cpe/ov/labs and the pt needs to contact office to set up appt  Needs follow up office visit with labs per pt was to schedule one for may per last OV discharge summery

## 2012-01-18 DIAGNOSIS — F0390 Unspecified dementia without behavioral disturbance: Secondary | ICD-10-CM | POA: Diagnosis present

## 2012-01-18 DIAGNOSIS — I959 Hypotension, unspecified: Secondary | ICD-10-CM

## 2012-01-18 DIAGNOSIS — D649 Anemia, unspecified: Secondary | ICD-10-CM | POA: Diagnosis present

## 2012-01-18 DIAGNOSIS — I359 Nonrheumatic aortic valve disorder, unspecified: Secondary | ICD-10-CM

## 2012-01-18 DIAGNOSIS — M6281 Muscle weakness (generalized): Secondary | ICD-10-CM

## 2012-01-18 DIAGNOSIS — I214 Non-ST elevation (NSTEMI) myocardial infarction: Secondary | ICD-10-CM | POA: Diagnosis present

## 2012-01-18 DIAGNOSIS — R112 Nausea with vomiting, unspecified: Secondary | ICD-10-CM

## 2012-01-18 DIAGNOSIS — N179 Acute kidney failure, unspecified: Secondary | ICD-10-CM | POA: Diagnosis present

## 2012-01-18 LAB — CARDIAC PANEL(CRET KIN+CKTOT+MB+TROPI)
Relative Index: 3.2 — ABNORMAL HIGH (ref 0.0–2.5)
Relative Index: 2.9 — ABNORMAL HIGH (ref 0.0–2.5)
Total CK: 200 U/L — ABNORMAL HIGH (ref 7–177)
Troponin I: 1.35 ng/mL (ref <0.30)

## 2012-01-18 LAB — DIFFERENTIAL
Basophils Relative: 0 % (ref 0–1)
Eosinophils Absolute: 0 10*3/uL (ref 0.0–0.7)
Eosinophils Relative: 1 % (ref 0–5)
Lymphocytes Relative: 29 % (ref 12–46)
Monocytes Absolute: 0.5 10*3/uL (ref 0.1–1.0)
Neutrophils Relative %: 59 % (ref 43–77)

## 2012-01-18 LAB — CBC
Hemoglobin: 9.3 g/dL — ABNORMAL LOW (ref 12.0–15.0)
MCH: 27.5 pg (ref 26.0–34.0)
Platelets: 155 10*3/uL (ref 150–400)
RBC: 3.38 MIL/uL — ABNORMAL LOW (ref 3.87–5.11)

## 2012-01-18 LAB — PROTIME-INR: Prothrombin Time: 14 seconds (ref 11.6–15.2)

## 2012-01-18 LAB — COMPREHENSIVE METABOLIC PANEL
Albumin: 3.2 g/dL — ABNORMAL LOW (ref 3.5–5.2)
Alkaline Phosphatase: 33 U/L — ABNORMAL LOW (ref 39–117)
BUN: 16 mg/dL (ref 6–23)
Calcium: 8.5 mg/dL (ref 8.4–10.5)
Creatinine, Ser: 0.86 mg/dL (ref 0.50–1.10)
GFR calc Af Amer: 69 mL/min — ABNORMAL LOW (ref >90)
Glucose, Bld: 98 mg/dL (ref 70–99)
Total Protein: 5.7 g/dL — ABNORMAL LOW (ref 6.0–8.3)

## 2012-01-18 LAB — APTT: aPTT: 25 seconds (ref 24–37)

## 2012-01-18 LAB — TSH: TSH: 4.363 u[IU]/mL (ref 0.350–4.500)

## 2012-01-18 LAB — PRO B NATRIURETIC PEPTIDE: Pro B Natriuretic peptide (BNP): 1132 pg/mL — ABNORMAL HIGH (ref 0–450)

## 2012-01-18 LAB — PHOSPHORUS: Phosphorus: 3.8 mg/dL (ref 2.3–4.6)

## 2012-01-18 LAB — MAGNESIUM: Magnesium: 1.8 mg/dL (ref 1.5–2.5)

## 2012-01-18 LAB — GLUCOSE, CAPILLARY: Glucose-Capillary: 98 mg/dL (ref 70–99)

## 2012-01-18 MED ORDER — KCL IN DEXTROSE-NACL 20-5-0.9 MEQ/L-%-% IV SOLN
INTRAVENOUS | Status: DC
  Administered 2012-01-18: 18:00:00 via INTRAVENOUS
  Filled 2012-01-18 (×3): qty 1000

## 2012-01-18 MED ORDER — DIPHENHYDRAMINE HCL 12.5 MG/5ML PO ELIX
12.5000 mg | ORAL_SOLUTION | Freq: Once | ORAL | Status: AC
  Administered 2012-01-19: 12.5 mg via ORAL
  Filled 2012-01-18: qty 5

## 2012-01-18 MED ORDER — LORAZEPAM 2 MG/ML IJ SOLN: 1.0000 mg | Freq: Once | INTRAMUSCULAR | Status: DC

## 2012-01-18 MED ORDER — DIPHENHYDRAMINE HCL 50 MG/ML IJ SOLN: 25.0000 mg | Freq: Once | INTRAMUSCULAR | Status: DC

## 2012-01-18 MED ORDER — METOPROLOL TARTRATE 50 MG PO TABS
50.0000 mg | ORAL_TABLET | Freq: Two times a day (BID) | ORAL | Status: DC
  Administered 2012-01-18 – 2012-01-19 (×2): 50 mg via ORAL
  Filled 2012-01-18 (×5): qty 1

## 2012-01-18 MED ORDER — ATORVASTATIN CALCIUM 10 MG PO TABS
10.0000 mg | ORAL_TABLET | Freq: Every day | ORAL | Status: DC
  Filled 2012-01-18 (×3): qty 1

## 2012-01-18 MED ORDER — ASPIRIN EC 81 MG PO TBEC
81.0000 mg | DELAYED_RELEASE_TABLET | Freq: Every day | ORAL | Status: DC
  Filled 2012-01-18 (×3): qty 1

## 2012-01-18 NOTE — Progress Notes (Signed)
CRITICAL VALUE ALERT  Critical value received:  CKMB 6.2, Troponin 0.8    Date of notification:  01/18/12  Time of notification:  0805  Critical value read back:yes   Nurse who received alert:  L.Dareen Piano, RN  MD notified (1st page):  Dr. Venetia Constable  Time of first page:  0807  MD notified (2nd page):  Time of second page:  Responding MD:    Time MD responded:

## 2012-01-18 NOTE — Progress Notes (Signed)
Pt sleeping this morning since change of shift.  Upon waking, pt became immediately agitated and began attempting to get out of bed, pulling on IV and O2 lines, and calling out that she wanted to get up.  Unable to reorient patient, oriented to self and family members only.  Family at bedside, requesting IV Haldol to keep her from pulling out her lines or getting up.  Haldol given per request.  Will continue to monitor.  Ardyth Gal, RN 01/18/2012

## 2012-01-18 NOTE — Plan of Care (Signed)
Problem: Phase I Progression Outcomes Goal: OOB as tolerated unless otherwise ordered Outcome: Not Progressing Pt very confused and agitated.  Unable to follow commands consistently.  Bed alarm activated, family at bedside for safety.

## 2012-01-18 NOTE — Progress Notes (Signed)
[name] is a 78 y.o. female admitted with palpitations. She was found to be anemic, and has ruled in for nstemi. Patient quite confused. I have seen and examined her in the presence of her 2 supportive daughters at bed side. She denies any complaints, no blood in stool(difficult to verify due to confusion).  1. Anemia   2. Chest pain, unspecified     Past Medical History  Diagnosis Date  . Hyperlipidemia   . Hypertension   . Dementia   . Thyroid disease     Hypothyroidism  . Hearing loss    Current Facility-Administered Medications  Medication Dose Route Frequency Provider Last Rate Last Dose  . 0.9 %  sodium chloride infusion   Intravenous Continuous Alison Murray, MD 75 mL/hr at 01/18/12 0430 75 mL/hr at 01/18/12 0430  . calcium-vitamin D (OSCAL WITH D) 500-200 MG-UNIT per tablet 1 tablet  1 tablet Oral BID Alison Murray, MD      . diphenhydrAMINE (BENADRYL) injection 25 mg  25 mg Intravenous Once Milissa Fesperman, MD      . levothyroxine (SYNTHROID, LEVOTHROID) tablet 75 mcg  75 mcg Oral Daily Alison Murray, MD      . LORazepam (ATIVAN) injection 1 mg  1 mg Intravenous Q6H PRN Alison Murray, MD   1 mg at 01/18/12 1309  . LORazepam (ATIVAN) injection 1 mg  1 mg Intravenous Once Dreamer Carillo, MD      . pantoprazole (PROTONIX) injection 40 mg  40 mg Intravenous Q12H Alison Murray, MD      . sodium chloride 0.9 % injection 3 mL  3 mL Intravenous Q12H Alison Murray, MD      . DISCONTD: haloperidol lactate (HALDOL) injection 1 mg  1 mg Intravenous Q6H PRN Alison Murray, MD   1 mg at 01/18/12 1023  . DISCONTD: haloperidol lactate (HALDOL) injection 1 mg  1 mg Intravenous Q6H PRN Juliet Rude. Rubin Payor, MD   1 mg at 01/17/12 2018   No Known Allergies Active Problems:  NSTEMI (non-ST elevated myocardial infarction)  Anemia  Dementia  AKI (acute kidney injury)   Vital signs in last 24 hours: Temp:  [97.1 F (36.2 C)-98.4 F (36.9 C)] 97.8 F (36.6 C) (06/19 1326) Pulse Rate:   [76-91] 84  (06/19 1326) Resp:  [16-22] 18  (06/19 1326) BP: (118-143)/(62-82) 123/72 mmHg (06/19 0410) SpO2:  [96 %-100 %] 96 % (06/19 1326) Weight:  [65.318 kg (144 lb)] 65.318 kg (144 lb) (06/18 2315) Weight change:  Last BM Date:  (pt nor family know LBM -- daughter states "over 24 hrs ago")  Intake/Output from previous day: 06/18 0701 - 06/19 0700 In: 1353.8 [I.V.:653.8; Blood:700] Out: -  Intake/Output this shift:    Lab Results:  Basename 01/18/12 0655 01/17/12 1520  WBC 4.8 5.2  HGB 9.3* 6.0*  HCT 28.6* 19.8*  PLT 155 228   BMET  Basename 01/18/12 0655 01/17/12 1520  NA 133* 133*  K 3.4* 3.5  CL 99 95*  CO2 22 27  GLUCOSE 98 118*  BUN 16 25*  CREATININE 0.86 1.11*  CALCIUM 8.5 9.5    Studies/Results: Dg Chest 2 View  01/17/2012  *RADIOLOGY REPORT*  Clinical Data: 78 year old female with chest palpitations. Hypertension.  CHEST - 2 VIEW  Comparison: 03/13/2006.  Findings: Mild to moderate cardiomegaly is increased. Other mediastinal contours are within normal limits.  Visualized tracheal air column is within normal limits.  EKG button artifact in the  right apex.  The lungs are clear.  No pneumothorax or effusion. Mild eventration of the right hemidiaphragm.  Right upper quadrant surgical clips. No acute osseous abnormality identified.  IMPRESSION: Cardiomegaly. No acute cardiopulmonary abnormality.  Original Report Authenticated By: Harley Hallmark, M.D.    Medications: I have reviewed the patient's current medications.   Physical exam GENERAL- alert HEAD- normal atraumatic, no neck masses, normal thyroid, no jvd RESPIRATORY- appears well, vitals normal, no respiratory distress, acyanotic, normal RR, ear and throat exam is normal, neck free of mass or lymphadenopathy, chest clear, no wheezing, crepitations, rhonchi, normal symmetric air entry CVS- regular rate and rhythm, S1, S2 normal, aortic area murmur, click, rub or gallop ABDOMEN- abdomen is soft without  significant tenderness, masses, organomegaly or guarding NEURO- Grossly normal EXTREMITIES- extremities normal, atraumatic, no cyanosis or edema  Plan   * NSTEMI (non-ST elevated myocardial infarction)- poor candidate for acute intervention due to dementia, and unclear cause of anemia. Discussed this with patient's 2 daughters. Will continue low dose asa, resume lopressor, add lipitor. Await 2 decho result. Monitor for any chance of gi bleeding. Family not leaning towards aggressive intervention. They will give [place] a decisive position tomorrow.  * Anemia- ? Cause. Follow stool hemo occult, vitamin b12/folate/iron level.  * Dementia- seems delirious. Trial of benadryl/ativan. D/c haldol. Family believes she is  More agitated because of the medications for the agitation.  * AKI (acute kidney injury)- likely prerenal with acei/hctz element. Better with fluids.    [name] 01/18/2012 5:17 PM Pager: [phone].

## 2012-01-18 NOTE — Progress Notes (Signed)
   CARE MANAGEMENT NOTE 01/18/2012  Patient:  SABREA, SANKEY   Account Number:  [id number]  Date Initiated:  01/18/2012  Documentation initiated by:  Jiles Crocker  Subjective/Objective Assessment:   ADMITTED WITH PALPITATIONS AND ANEMIA     Action/Plan:   PCP: Neena Rhymes, MD; LIVES AT HOME WITH SPOUSE; AWAITING ON PT/OT EVALS FOR DISPOSITION AND OR THE NEED FOR HHC SERVICES;   Anticipated DC Date:  01/25/2012   Anticipated DC Plan:  HOME W HOME HEALTH SERVICES      DC Planning Services  CM consult             Status of service:  In process, will continue to follow Medicare Important Message given?  NA - LOS <3 / Initial given by admissions (If response is "NO", the following Medicare IM given date fields will be blank)  Per UR Regulation:  Reviewed for med. necessity/level of care/duration of stay  Comments:  01/18/2012- B Nicko Daher RN, BSN, MHA

## 2012-01-18 NOTE — Progress Notes (Signed)
PT Cancellation Note  Evaluation cancelled today due to medical issues with patient which prohibited therapy.  RN reports pt confused and agitated and not appropriate for therapy today.  Will check back tomorrow for evaluation.  Mohamedamin Nifong,KATHrine E 01/18/2012, 1:58 PM Pager: [phone]

## 2012-01-18 NOTE — Progress Notes (Signed)
  Echocardiogram 2D Echocardiogram has been performed.  [name] 01/18/2012, 1:18 PM

## 2012-01-19 DIAGNOSIS — N390 Urinary tract infection, site not specified: Secondary | ICD-10-CM | POA: Diagnosis present

## 2012-01-19 DIAGNOSIS — I214 Non-ST elevation (NSTEMI) myocardial infarction: Secondary | ICD-10-CM

## 2012-01-19 DIAGNOSIS — R112 Nausea with vomiting, unspecified: Secondary | ICD-10-CM

## 2012-01-19 DIAGNOSIS — F05 Delirium due to known physiological condition: Secondary | ICD-10-CM

## 2012-01-19 DIAGNOSIS — E782 Mixed hyperlipidemia: Secondary | ICD-10-CM

## 2012-01-19 LAB — IRON AND TIBC: UIBC: 409 ug/dL — ABNORMAL HIGH (ref 125–400)

## 2012-01-19 LAB — CBC
MCH: 27.6 pg (ref 26.0–34.0)
MCHC: 32.6 g/dL (ref 30.0–36.0)
Platelets: 132 10*3/uL — ABNORMAL LOW (ref 150–400)
RDW: 15.1 % (ref 11.5–15.5)

## 2012-01-19 LAB — TYPE AND SCREEN
ABO/RH(D): O NEG
Unit division: 0
Unit division: 0

## 2012-01-19 LAB — COMPREHENSIVE METABOLIC PANEL
ALT: 15 U/L (ref 0–35)
Albumin: 3.3 g/dL — ABNORMAL LOW (ref 3.5–5.2)
Alkaline Phosphatase: 40 U/L (ref 39–117)
Calcium: 8.5 mg/dL (ref 8.4–10.5)
GFR calc Af Amer: 67 mL/min — ABNORMAL LOW (ref >90)
Potassium: 3.4 mEq/L — ABNORMAL LOW (ref 3.5–5.1)
Sodium: 133 mEq/L — ABNORMAL LOW (ref 135–145)
Total Protein: 6.3 g/dL (ref 6.0–8.3)

## 2012-01-19 LAB — LIPID PANEL
HDL: 58 mg/dL (ref >39)
Total CHOL/HDL Ratio: 2.1 RATIO
VLDL: 16 mg/dL (ref 0–40)

## 2012-01-19 LAB — VITAMIN B12: Vitamin B-12: 1290 pg/mL — ABNORMAL HIGH (ref 211–911)

## 2012-01-19 LAB — RETICULOCYTES
RBC.: 3.69 MIL/uL — ABNORMAL LOW (ref 3.87–5.11)
Retic Count, Absolute: 118.1 10*3/uL (ref 19.0–186.0)
Retic Ct Pct: 3.2 % — ABNORMAL HIGH (ref 0.4–3.1)

## 2012-01-19 LAB — FOLATE: Folate: 13 ng/mL

## 2012-01-19 MED ORDER — ACETAMINOPHEN 325 MG PO TABS
650.0000 mg | ORAL_TABLET | ORAL | Status: DC | PRN
  Administered 2012-01-19: 650 mg via ORAL
  Filled 2012-01-19 (×2): qty 2

## 2012-01-19 MED ORDER — ACETAMINOPHEN 650 MG RE SUPP
650.0000 mg | RECTAL | Status: DC | PRN
Start: 2012-01-19 — End: 2012-01-20

## 2012-01-19 MED ORDER — TRAZODONE 25 MG HALF TABLET
25.0000 mg | ORAL_TABLET | Freq: Once | ORAL | Status: AC
  Administered 2012-01-19: 25 mg via ORAL
  Filled 2012-01-19: qty 1

## 2012-01-19 MED ORDER — SODIUM CHLORIDE 0.9 % IV BOLUS (SEPSIS)
250.0000 mL | Freq: Once | INTRAVENOUS | Status: AC
  Administered 2012-01-19: 250 mL via INTRAVENOUS

## 2012-01-19 MED ORDER — CIPROFLOXACIN IN D5W 400 MG/200ML IV SOLN
400.0000 mg | Freq: Two times a day (BID) | INTRAVENOUS | Status: DC
  Administered 2012-01-19 – 2012-01-20 (×3): 400 mg via INTRAVENOUS
  Filled 2012-01-19 (×4): qty 200

## 2012-01-19 NOTE — Progress Notes (Signed)
Pt still experiencing intermittent periods of agitation and periods of sleep.  Any time staff tries to interact with pt for feeding, cleaning, repositioning or treatments such as vital signs or EKG pt becomes very agitated.  Heart rate has been sustaining in the 120s since early morning, even during sleep periods.  Unable to obtain vital signs or EKG due to combative behavior.  Notified Dr. Venetia Constable of HR.  Family aware and stated to RN that since they have decided not to move forward with invasive cardiac interventions, they would prefer to simply take pt home as they believe her mentation will significantly improve when she is back in her familiar environment.  Encouraged family to discuss this with Dr. Venetia Constable on his rounds.  Will continue to monitor pt.

## 2012-01-19 NOTE — Progress Notes (Signed)
Pt has been confused all night, prn ativan did not help to keep pt calm, it made her more agitated. Family requested benadryl to help her sleep, K.Kirby (PA) was notified and she gave the order, it made her even more agitated. Family stated they did not wanted pt. to get either of these meds again. Trazodone was ordered, which did not help very much. Pt has been trying to get out of bed, kicking etc, she would not let staff get vital signs, ordered EKG or labs this am. Will report it off to and pass it off to the am rn and staffs to get them when pt is calm. Braiden Rodman, Joannah Gitlin, rn

## 2012-01-19 NOTE — Progress Notes (Signed)
[name] has remained mostly agitated overnight. She has fever 100.59F, and UA suggests a uti. 2decho shows normal ef, with no WMA.  SUBJECTIVE "ok".   1. Anemia   2. Chest pain, unspecified     Past Medical History  Diagnosis Date  . Hyperlipidemia   . Hypertension   . Dementia   . Thyroid disease     Hypothyroidism  . Hearing loss    Current Facility-Administered Medications  Medication Dose Route Frequency Provider Last Rate Last Dose  . acetaminophen (TYLENOL) suppository 650 mg  650 mg Rectal Q4H PRN Guiseppe Flanagan, MD      . acetaminophen (TYLENOL) tablet 650 mg  650 mg Oral Q4H PRN Mikeisha Lemonds, MD   650 mg at 01/19/12 1117  . aspirin EC tablet 81 mg  81 mg Oral Daily Daneesha Quinteros, MD      . atorvastatin (LIPITOR) tablet 10 mg  10 mg Oral q1800 Lief Palmatier, MD      . calcium-vitamin D (OSCAL WITH D) 500-200 MG-UNIT per tablet 1 tablet  1 tablet Oral BID Alison Murray, MD   1 tablet at 01/18/12 2110  . ciprofloxacin (CIPRO) IVPB 400 mg  400 mg Intravenous Q12H Zema Lizardo, MD      . dextrose 5 % and 0.9 % NaCl with KCl 20 mEq/L infusion   Intravenous Continuous Sherman Lipuma, MD 50 mL/hr at 01/18/12 1818    . diphenhydrAMINE (BENADRYL) 12.5 MG/5ML elixir 12.5 mg  12.5 mg Oral Once Caroline More, NP   12.5 mg at 01/19/12 0001  . levothyroxine (SYNTHROID, LEVOTHROID) tablet 75 mcg  75 mcg Oral Daily Alison Murray, MD      . LORazepam (ATIVAN) injection 1 mg  1 mg Intravenous Q6H PRN Alison Murray, MD   1 mg at 01/18/12 2252  . metoprolol (LOPRESSOR) tablet 50 mg  50 mg Oral BID Eldean Klatt, MD   50 mg at 01/19/12 1119  . pantoprazole (PROTONIX) injection 40 mg  40 mg Intravenous Q12H Alison Murray, MD   40 mg at 01/18/12 2112  . sodium chloride 0.9 % bolus 250 mL  250 mL Intravenous Once Birda Didonato, MD      . sodium chloride 0.9 % injection 3 mL  3 mL Intravenous Q12H Alison Murray, MD      . traZODone (DESYREL) tablet 25 mg  25 mg Oral Once Caroline More, NP   25  mg at 01/19/12 4098  . DISCONTD: 0.9 %  sodium chloride infusion   Intravenous Continuous Yago Ludvigsen, MD   75 mL/hr at 01/18/12 0430  . DISCONTD: diphenhydrAMINE (BENADRYL) injection 25 mg  25 mg Intravenous Once Javonte Elenes, MD      . DISCONTD: haloperidol lactate (HALDOL) injection 1 mg  1 mg Intravenous Q6H PRN Alison Murray, MD   1 mg at 01/18/12 1023  . DISCONTD: haloperidol lactate (HALDOL) injection 1 mg  1 mg Intravenous Q6H PRN Juliet Rude. Pickering, MD   1 mg at 01/17/12 2018  . DISCONTD: LORazepam (ATIVAN) injection 1 mg  1 mg Intravenous Once Shamaine Mulkern, MD       No Known Allergies Active Problems:  NSTEMI (non-ST elevated myocardial infarction)  Anemia  Dementia  AKI (acute kidney injury)  UTI (lower urinary tract infection)   Vital signs in last 24 hours: Temp:  [97.6 F (36.4 C)-100.4 F (38 C)] 100.4 F (38 C) (06/20 1105) Pulse Rate:  [84-98] 98  (06/20 0935)  Resp:  [18-20] 20  (06/20 0935) BP: (156)/(71) 156/71 mmHg (06/19 2140) SpO2:  [96 %-97 %] 97 % (06/19 2140) Weight:  [64.1 kg (141 lb 5 oz)] 64.1 kg (141 lb 5 oz) (06/20 0500) Weight change: -1.218 kg (-2 lb 11 oz) Last BM Date:  (unable to recall LBM)  Intake/Output from previous day: 06/19 0701 - 06/20 0700 In: 705 [P.O.:120; I.V.:585] Out: -  Intake/Output this shift: Total I/O In: 120 [P.O.:120] Out: -   Lab Results:  Basename 01/19/12 1015 01/18/12 0655  WBC 7.8 4.8  HGB 10.2* 9.3*  HCT 31.3* 28.6*  PLT 132* 155   BMET  Basename 01/19/12 1015 01/18/12 0655  NA 133* 133*  K 3.4* 3.4*  CL 98 99  CO2 23 22  GLUCOSE 114* 98  BUN 6 16  CREATININE 0.88 0.86  CALCIUM 8.5 8.5    Studies/Results: Dg Chest 2 View  01/17/2012  *RADIOLOGY REPORT*  Clinical Data: 78 year old female with chest palpitations. Hypertension.  CHEST - 2 VIEW  Comparison: 03/13/2006.  Findings: Mild to moderate cardiomegaly is increased. Other mediastinal contours are within normal limits.  Visualized  tracheal air column is within normal limits.  EKG button artifact in the right apex.  The lungs are clear.  No pneumothorax or effusion. Mild eventration of the right hemidiaphragm.  Right upper quadrant surgical clips. No acute osseous abnormality identified.  IMPRESSION: Cardiomegaly. No acute cardiopulmonary abnormality.  Original Report Authenticated By: Harley Hallmark, M.D.    Medications: I have reviewed the patient's current medications.   Physical exam GENERAL- alert, warm to touch. HEAD- normal atraumatic, no neck masses, normal thyroid, no jvd RESPIRATORY- appears well, vitals normal, no respiratory distress, acyanotic, normal RR, ear and throat exam is normal, neck free of mass or lymphadenopathy, chest clear, no wheezing, crepitations, rhonchi, normal symmetric air entry CVS- regular rate and rhythm, S1, S2 normal, no murmur, click, rub or gallop ABDOMEN- abdomen is soft without significant tenderness, masses, organomegaly or guarding NEURO- remains confused. EXTREMITIES- extremities normal, atraumatic, no cyanosis or edema  Plan   * NSTEMI (non-ST elevated myocardial infarction)- family not interested in further cardiac work up at present. Will focus on medical management. * Anemia- ? Cause. Follow stool hemo occult, vitamin b12/folate/iron level.  * Dementia- Monitor response with abx/tylenol/ivf. Relatives uneasy with sedatives. * Uti- most likely cause of delirium. Uc/bc. Start cipro iv. * AKI (acute kidney injury)- likely prerenal with acei/hctz element. Better with fluids.   Discussed plan of care with daughter and husband at bed side.   [name] 01/19/2012 11:20 AM Pager: [phone].

## 2012-01-19 NOTE — Progress Notes (Signed)
PT Cancellation Note  Evaluation cancelled today due to family wishes for pt to sleep.  RN requested PT wait til afternoon to see pt and upon checking back this afternoon pt sleeping and family member requesting for pt to sleep.  Shamina Etheridge,KATHrine E 01/19/2012, 3:10 PM Pager: [phone]

## 2012-01-20 ENCOUNTER — Telehealth: Payer: Self-pay | Admitting: Family Medicine

## 2012-01-20 DIAGNOSIS — F05 Delirium due to known physiological condition: Secondary | ICD-10-CM

## 2012-01-20 DIAGNOSIS — R112 Nausea with vomiting, unspecified: Secondary | ICD-10-CM

## 2012-01-20 DIAGNOSIS — E782 Mixed hyperlipidemia: Secondary | ICD-10-CM

## 2012-01-20 DIAGNOSIS — I214 Non-ST elevation (NSTEMI) myocardial infarction: Secondary | ICD-10-CM

## 2012-01-20 LAB — COMPREHENSIVE METABOLIC PANEL
Albumin: 2.8 g/dL — ABNORMAL LOW (ref 3.5–5.2)
Chloride: 103 mEq/L (ref 96–112)
GFR calc Af Amer: 62 mL/min — ABNORMAL LOW (ref >90)
GFR calc non Af Amer: 53 mL/min — ABNORMAL LOW (ref >90)
Potassium: 3.9 mEq/L (ref 3.5–5.1)
Sodium: 134 mEq/L — ABNORMAL LOW (ref 135–145)
Total Protein: 5.5 g/dL — ABNORMAL LOW (ref 6.0–8.3)

## 2012-01-20 LAB — CBC
MCHC: 32.1 g/dL (ref 30.0–36.0)
Platelets: 58 10*3/uL — ABNORMAL LOW (ref 150–400)
RDW: 15.4 % (ref 11.5–15.5)
WBC: 7.5 10*3/uL (ref 4.0–10.5)

## 2012-01-20 MED ORDER — ATORVASTATIN CALCIUM 10 MG PO TABS: 10.0000 mg | ORAL_TABLET | Freq: Every day | ORAL | Status: DC

## 2012-01-20 MED ORDER — CIPROFLOXACIN HCL 500 MG PO TABS: 500.0000 mg | ORAL_TABLET | Freq: Two times a day (BID) | ORAL | Status: AC

## 2012-01-20 NOTE — Discharge Summary (Signed)
DISCHARGE SUMMARY  KEIARAH ORLOWSKI  MR#: [id number]  DOB:[birth year]  Date of Admission: 01/17/2012 Date of Discharge: 01/20/2012  Attending Physician:Alastair Hennes  Patient's WUJ:[id number] Beverely Low, MD  Consults: none. Discharge Diagnoses: Present on Admission:  .NSTEMI (non-ST elevated myocardial infarction) .Anemia .Dementia .AKI (acute kidney injury) .UTI (lower urinary tract infection)  Hospital Course: Mrs Brazzle was admitted with palpitations and delirium. She was found to have nstemi/anemia/uti(likely cause of the delirium). No evidence of acute bleeding. Ef 65% with no WMA, mild aortic stenosis. UC pending at d/c. Delirium cleared once patient started on cipro. She needs 5 more days of cipro. Family elected no further cardiac work up due to the dementia. She will be treated medically. Family would like to take her home today as they say she is better oriented and less confused at home. She is discharged in stable condition to the care of her family. Have also requested home health.   Medication List  As of 01/20/2012 11:11 AM   TAKE these medications         aspirin 81 MG tablet   Take 81 mg by mouth 2 (two) times daily.      atorvastatin 10 MG tablet   Commonly known as: LIPITOR   Take 1 tablet (10 mg total) by mouth daily at 6 PM.      calcium-vitamin D 500-200 MG-UNIT per tablet   Commonly known as: OSCAL WITH D   Take 1 tablet by mouth 2 (two) times daily.      ciprofloxacin 500 MG tablet   Commonly known as: CIPRO   Take 1 tablet (500 mg total) by mouth 2 (two) times daily.      donepezil 5 MG tablet   Commonly known as: ARICEPT   Take 1 tablet (5 mg total) by mouth at bedtime.      levothyroxine 75 MCG tablet   Commonly known as: SYNTHROID, LEVOTHROID   Take 1 tablet (75 mcg total) by mouth daily. Brand Name.      lisinopril-hydrochlorothiazide 20-12.5 MG per tablet   Commonly known as: PRINZIDE,ZESTORETIC   Take 1 tablet by mouth daily.     MELATONIN PO   Take by mouth at bedtime.      metoprolol 50 MG tablet   Commonly known as: LOPRESSOR   Take 1 tablet (50 mg total) by mouth 2 (two) times daily.      potassium chloride SA 20 MEQ tablet   Commonly known as: K-DUR,KLOR-CON   Take 1 tablet (20 mEq total) by mouth daily.      temazepam 30 MG capsule   Commonly known as: RESTORIL   Take 1 capsule (30 mg total) by mouth at bedtime as needed.             Day of Discharge BP 127/75  Pulse 89  Temp 98.1 F (36.7 C) (Axillary)  Resp 20  Ht 5\' 3"  (1.6 m)  Wt 64.1 kg (141 lb 5 oz)  BMI 25.03 kg/m2  SpO2 94%  Physical Exam: Calm, comfortable at rest.  Results for orders placed during the hospital encounter of 01/17/12 (from the past 24 hour(s))  CULTURE, BLOOD (ROUTINE X 2)     Status: Normal (Preliminary result)   Collection Time   01/19/12 12:35 PM      Component Value Range   Specimen Description BLOOD RIGHT HAND     Special Requests BOTTLES DRAWN AEROBIC AND ANAEROBIC 2CC     Culture  Setup Time [id number]  Culture       Value:        BLOOD CULTURE RECEIVED NO GROWTH TO DATE CULTURE WILL BE HELD FOR 5 DAYS BEFORE ISSUING A FINAL NEGATIVE REPORT   Report Status PENDING    CULTURE, BLOOD (ROUTINE X 2)     Status: Normal (Preliminary result)   Collection Time   01/19/12 12:45 PM      Component Value Range   Specimen Description BLOOD LEFT HAND     Special Requests BOTTLES DRAWN AEROBIC AND ANAEROBIC 6CC     Culture  Setup Time [id number]     Culture       Value:        BLOOD CULTURE RECEIVED NO GROWTH TO DATE CULTURE WILL BE HELD FOR 5 DAYS BEFORE ISSUING A FINAL NEGATIVE REPORT   Report Status PENDING    CBC     Status: Abnormal   Collection Time   01/20/12  4:06 AM      Component Value Range   WBC 7.5  4.0 - 10.5 K/uL   RBC 3.43 (*) 3.87 - 5.11 MIL/uL   Hemoglobin 9.3 (*) 12.0 - 15.0 g/dL   HCT 81.1 (*) 91.4 - 78.2 %   MCV 84.5  78.0 - 100.0 fL   MCH 27.1  26.0 - 34.0 pg   MCHC 32.1  30.0 -  36.0 g/dL   RDW 95.6  21.3 - 08.6 %   Platelets 58 (*) 150 - 400 K/uL  COMPREHENSIVE METABOLIC PANEL     Status: Abnormal   Collection Time   01/20/12  4:06 AM      Component Value Range   Sodium 134 (*) 135 - 145 mEq/L   Potassium 3.9  3.5 - 5.1 mEq/L   Chloride 103  96 - 112 mEq/L   CO2 21  19 - 32 mEq/L   Glucose, Bld 93  70 - 99 mg/dL   BUN 8  6 - 23 mg/dL   Creatinine, Ser 5.78  0.50 - 1.10 mg/dL   Calcium 8.0 (*) 8.4 - 10.5 mg/dL   Total Protein 5.5 (*) 6.0 - 8.3 g/dL   Albumin 2.8 (*) 3.5 - 5.2 g/dL   AST 54 (*) 0 - 37 U/L   ALT 16  0 - 35 U/L   Alkaline Phosphatase 33 (*) 39 - 117 U/L   Total Bilirubin 1.6 (*) 0.3 - 1.2 mg/dL   GFR calc non Af Amer 53 (*) >90 mL/min   GFR calc Af Amer 62 (*) >90 mL/min    Disposition: home today.   Follow-up Appts: Discharge Orders    Future Orders Please Complete By Expires   Diet - low sodium heart healthy      Increase activity slowly           Tests Needing Follow-up: Bmp/cbc in 1 week.  Time spent in discharge (includes decision making & examination of pt): 40 minutes  Signed: Jakeem Grape 01/20/2012, 11:11 AM

## 2012-01-20 NOTE — Progress Notes (Signed)
Talked to patient with spouse and daughter present for home health care choices; Patient asleep, daughter Cordelia Pen and spouse chose [place] for HHC, they only want HHPT at this time; CM explained the importance of the other services but they did not want them but if they changed their mind it can be added. Venia Minks RN with [facility] called for arrangements; B Tish Frederickson, BSN, MHA.

## 2012-01-20 NOTE — Telephone Encounter (Signed)
Caller: Cliff/; PCP: Sheliah Hatch.; CB#: [phone]; ; ; Call regarding Lisinopril refill.  Per Pharmacist/Pt states, Lisinopril RX was sent into Mail order, [facility].  Per Epic, no RX seen for Lisinopril, consulted w/ Dr Sharen Hones d/t Pharmacist has RX bottle last filled by [facility] on 10-04-11, MD gave verbal order for Lisinopril 20-12.5mg  1 tab daily, #7, no refills. Advised Pt to f/u w/ Office on 6-24 for RX.

## 2012-01-20 NOTE — Evaluation (Signed)
Physical Therapy Evaluation Patient Details Name: [name] MRN: [id number] DOB: July 28, 1924 Today's Date: 01/20/2012 Time: 7829-5621 PT Time Calculation (min): 18 min  PT Assessment / Plan / Recommendation Clinical Impression  Pt admitted for NSTEMI, anemia, UTI, also with dementia. Pt plannign to DC within the hour, so PT to assess home needs. Per eval, PT discussed option for SNF, but family wanting to take pt home at this time and will then determine if able to care for her. Per family report, daughter and granddaughter will be able to provide assist if elderly spouse unable.      PT Assessment  All further PT needs can be met in the next venue of care    Follow Up Recommendations  Home health PT;Skilled nursing facility;Supervision/Assistance - 24 hour    Barriers to Discharge        lEquipment Recommendations  Rolling walker with 5" wheels;3 in 1 bedside comode    Recommendations for Other Services     Frequency      Precautions / Restrictions Precautions Precautions: Fall   Pertinent Vitals/Pain No complaints      Mobility  Bed Mobility Bed Mobility: Supine to Sit Supine to Sit: 3: Mod assist;HOB flat;With rails Details for Bed Mobility Assistance: Lifting assist and cues for technique Transfers Transfers: Stand to Sit;Sit to Stand Sit to Stand: 3: Mod assist;With upper extremity assist;From bed;From chair/3-in-1 Stand to Sit: 4: Min assist;With upper extremity assist;To chair/3-in-1 Details for Transfer Assistance: Lifting and lowering assist with cues for hand placement Ambulation/Gait Ambulation/Gait Assistance: 4: Min assist Ambulation Distance (Feet): 15 Feet Assistive device: Rolling walker Ambulation/Gait Assistance Details: Steadyign assist, distance limited by fatigue Gait Pattern: Step-through pattern;Decreased stride length;Shuffle;Trunk flexed Stairs: No (Not wanting to perform stairs due to preparing to DC soon) Naval architect  Mobility: No         PT Diagnosis: Difficulty walking;Generalized weakness  PT Problem List: Decreased strength;Decreased activity tolerance;Decreased balance;Decreased mobility;Decreased cognition;Decreased knowledge of use of DME;Decreased safety awareness PT Treatment Interventions:     PT Goals    Visit Information  Last PT Received On: 01/20/12 Assistance Needed: +1    Subjective Data  Subjective: Do I have to? (walk) Patient Stated Goal: Go home   Prior Functioning  Home Living Lives With: Spouse Available Help at Discharge: Family;Available 24 hours/day Type of Home: House Home Access: Ramped entrance Home Layout: Multi-level (split ) Alternate Level Stairs-Number of Steps: 10 Daughter states pt may sleep on couch and use BSC in case she can't do stairs to bedroom Alternate Level Stairs-Rails: Right;Left Bathroom Shower/Tub: Other (comment) (Sink bathes) Home Adaptive Equipment: None Prior Function Level of Independence: Independent Able to Take Stairs?: Yes Communication Communication: HOH    Cognition  Overall Cognitive Status: History of cognitive impairments - at baseline Arousal/Alertness: Lethargic Orientation Level: Appears intact for tasks assessed Behavior During Session: [facility] for tasks performed    Extremity/Trunk Assessment Right Lower Extremity Assessment RLE ROM/Strength/Tone: [facility] for tasks assessed Left Lower Extremity Assessment LLE ROM/Strength/Tone: [facility] for tasks assessed   Balance Balance Balance Assessed: No  End of Session PT - End of Session Equipment Utilized During Treatment: Gait belt Activity Tolerance: Patient limited by fatigue Patient left: in chair;with call bell/phone within reach;with family/visitor present Nurse Communication: Mobility status   Virl Cagey, PT  01/20/2012, 12:02 PM

## 2012-01-20 NOTE — Progress Notes (Signed)
[facility] called for DME - rolling walker and bedside commode; B Lobbyist, BSN, [place].

## 2012-01-23 ENCOUNTER — Telehealth: Payer: Self-pay

## 2012-01-23 MED ORDER — LISINOPRIL-HYDROCHLOROTHIAZIDE 20-12.5 MG PO TABS: 1.0000 | ORAL_TABLET | Freq: Every day | ORAL | Status: DC

## 2012-01-23 NOTE — Telephone Encounter (Signed)
Message recorded on Triage voicemail at 8:59 please call concerning one of patient's medications, patient's daughter is aware that she has to call pharmacy about refills but this is a different concern

## 2012-01-23 NOTE — Telephone Encounter (Signed)
Called pt daughter Cordelia Pen to advise that pt was advised on 08-23-11 via discharge summery to have pt come in the last of may for follow up with labs for cholesterol and BP check, pt daughter noted she did not realize that, advised that the aricept is no longer working for the pt, advised I can set pt up for follow up visit for this week and she can also discuss Aricept at the time, pt daughter declined and noted that the pt is still too weak to come in, notes she will call our office in approximately a month to have her come in, advised to send pt 90 day supply of the lisinopril per MD Beverely Low, pt daughter understood and will call back to set up apt, MD Tabori made aware that pt will wait for a follow up per pt daughter did not note that the hospital advised for her to follow up with PCP, rx sent to pharmacy for #90 for lisinopril via escribe

## 2012-01-23 NOTE — Telephone Encounter (Signed)
Left message to call office

## 2012-01-23 NOTE — Telephone Encounter (Signed)
See other phone note, discussed with family

## 2012-01-24 ENCOUNTER — Other Ambulatory Visit: Payer: Self-pay | Admitting: Family Medicine

## 2012-01-24 NOTE — Telephone Encounter (Signed)
refill lisinopril - hctz 20-12.5mg  tab #7, take one tablet by mouth every day last fill 6.21.13, last ov 1.22.13 *PLEASE see previous 2-notes regarding this medication

## 2012-01-24 NOTE — Telephone Encounter (Signed)
Pt medication has already been sent via escribe to [facility] on [address] 01-23-12, pt family aware

## 2012-01-25 LAB — CULTURE, BLOOD (ROUTINE X 2)
Culture  Setup Time: 201306202216
Culture: NO GROWTH

## 2012-02-06 ENCOUNTER — Ambulatory Visit (INDEPENDENT_AMBULATORY_CARE_PROVIDER_SITE_OTHER): Payer: Medicare Other | Admitting: Family Medicine

## 2012-02-06 ENCOUNTER — Encounter: Payer: Self-pay | Admitting: Family Medicine

## 2012-02-06 VITALS — BP 118/78 | HR 76 | Temp 97.8°F | Ht 62.0 in | Wt 139.0 lb

## 2012-02-06 DIAGNOSIS — E785 Hyperlipidemia, unspecified: Secondary | ICD-10-CM

## 2012-02-06 DIAGNOSIS — D649 Anemia, unspecified: Secondary | ICD-10-CM

## 2012-02-06 DIAGNOSIS — I214 Non-ST elevation (NSTEMI) myocardial infarction: Secondary | ICD-10-CM

## 2012-02-06 DIAGNOSIS — I10 Essential (primary) hypertension: Secondary | ICD-10-CM

## 2012-02-06 LAB — CBC WITH DIFFERENTIAL/PLATELET
Basophils Absolute: 0 10*3/uL (ref 0.0–0.1)
Basophils Relative: 0.4 % (ref 0.0–3.0)
Eosinophils Absolute: 0.1 10*3/uL (ref 0.0–0.7)
MCHC: 31.2 g/dL (ref 30.0–36.0)
MCV: 79.9 fl (ref 78.0–100.0)
Monocytes Absolute: 0.4 10*3/uL (ref 0.1–1.0)
Neutrophils Relative %: 64.2 % (ref 43.0–77.0)
Platelets: 214 10*3/uL (ref 150.0–400.0)
RDW: 20.1 % — ABNORMAL HIGH (ref 11.5–14.6)

## 2012-02-06 LAB — BASIC METABOLIC PANEL
BUN: 18 mg/dL (ref 6–23)
GFR: 59.24 mL/min — ABNORMAL LOW (ref >60.00)
Potassium: 4 mEq/L (ref 3.5–5.1)
Sodium: 137 mEq/L (ref 135–145)

## 2012-02-06 MED ORDER — POTASSIUM CHLORIDE CRYS ER 20 MEQ PO TBCR: 20.0000 meq | EXTENDED_RELEASE_TABLET | Freq: Every day | ORAL | Status: DC

## 2012-02-06 MED ORDER — ATORVASTATIN CALCIUM 10 MG PO TABS: 10.0000 mg | ORAL_TABLET | Freq: Every day | ORAL | Status: DC

## 2012-02-06 MED ORDER — LEVOTHYROXINE SODIUM 75 MCG PO TABS: 75.0000 ug | ORAL_TABLET | Freq: Every day | ORAL | Status: DC

## 2012-02-06 MED ORDER — LISINOPRIL-HYDROCHLOROTHIAZIDE 20-12.5 MG PO TABS: 1.0000 | ORAL_TABLET | Freq: Every day | ORAL | Status: DC

## 2012-02-06 NOTE — Patient Instructions (Addendum)
Please call and schedule with cardiology due to her recent heart attack We'll notify you of your lab results and make any changes if needed Start an over the counter iron supplement.  Make sure you are taking a daily stool softener All meds were sent to [facility] Call with any questions or concerns- particularly if you need help w/ placement or other concerns Hang in there!!!

## 2012-02-06 NOTE — Progress Notes (Signed)
  Subjective:    Patient ID: [name], female    DOB: 26-Sep-1923, 78 y.o.   MRN: [id number]  HPI Hospital f/u- NSTEMI.  Family did not want to pursue invasive cardiac testing.  Recent lipids show excellent control on lipitor.  Denies abd pain, N/V, myalgias.  Was found to be anemic- received 2 units of blood while hospitalized.  Due for repeat labs today.  Still having fatigue but reports feeling better.  No known source for blood loss identified- family declines investigative colonoscopy.  Goal is to control cardiac risk factors- BP excellent today.  No CP, SOB, HAs.  Daughter reports dementia has worsened since hospitalization but feels she is able to remain at home.  Ambulating w/out difficulty.   Review of Systems For ROS see HPI     Objective:   Physical Exam  Vitals reviewed. Constitutional: She appears well-developed and well-nourished. No distress.  HENT:  Head: Normocephalic and atraumatic.  Eyes: Conjunctivae and EOM are normal. Pupils are equal, round, and reactive to light.  Neck: Normal range of motion. Neck supple. No thyromegaly present.  Cardiovascular: Normal rate, regular rhythm, normal heart sounds and intact distal pulses.   No murmur heard. Pulmonary/Chest: Effort normal and breath sounds normal. No respiratory distress.  Abdominal: Soft. She exhibits no distension. There is no tenderness.  Musculoskeletal: She exhibits no edema.  Lymphadenopathy:    She has no cervical adenopathy.  Neurological: She is alert.       Oriented to person only  Skin: Skin is warm and dry. There is pallor.  Psychiatric: She has a normal mood and affect. Her behavior is normal.          Assessment & Plan:

## 2012-02-07 ENCOUNTER — Encounter: Payer: Self-pay | Admitting: *Deleted

## 2012-02-08 ENCOUNTER — Telehealth: Payer: Self-pay | Admitting: Family Medicine

## 2012-02-08 NOTE — Telephone Encounter (Signed)
Please advise  Allergies added to chart as unknown reactions/severe

## 2012-02-08 NOTE — Telephone Encounter (Signed)
Patient's daughter called and stated that she was advised by Beverely Low to see cardiologist.    1-Patient could not get appt til 8.21.13, wanted to make sure that was ok with dr.tabori  2-daughter stated mom had a sever reaction to the following medications whild in hospital  Ativan, haldol & benadryl. She wants to make sure this is noted in the chart for future reference Call back # for Cordelia Pen is [phone]

## 2012-02-08 NOTE — Telephone Encounter (Signed)
Ok as far as cardiology appt goes.  If pt had 'severe rxn' to any meds, it should have been noted in chart (hospital is on same system).  Highly unlikely that pt had rxn to benadryl as this is what txs allergic rxns.  All of those meds likely caused sedation and confusion in pt but this is not allergy- need to know if she had rash, hives, difficulty breathing, etc.

## 2012-02-09 NOTE — Telephone Encounter (Signed)
Called pt daughter whom advised the reaction was that the medication made her hyper instead of relax, removed benadryl reaction and changed haldol and ativan to low allergy and commented really just made pt hyper, pt daughter understood that the apt is ok

## 2012-02-14 ENCOUNTER — Other Ambulatory Visit: Payer: Medicare Other

## 2012-02-14 ENCOUNTER — Telehealth: Payer: Self-pay | Admitting: *Deleted

## 2012-02-14 NOTE — Telephone Encounter (Signed)
noted 

## 2012-02-14 NOTE — Telephone Encounter (Signed)
FYI: Pt daughter called in and spoke with scheduler Judeth Cornfield to advise that the pt refused to get in the car to come to the office for her lab draw scheduled today for CBC w diff, pt daughter informed by scheduler if in the near future she is able to get the pt in the car to call the office to let us know so we can schedule the apt. Pt daughter understood

## 2012-02-21 NOTE — Telephone Encounter (Signed)
Called pt daughter to advise the following per MD Tabori:  She missed her CBC and needs one ASAP to make sure her hgb isn't continuing to drop and placing her at risk for repeat heart attack  Pt daughter again having difficult time getting the pt to come in per dementia, does note that she will be coming to [place] on Friday and plans to try again to get the pt back into the car to come to our office for a lab draw, however per unsure what time she will be able to do so the pt daughter will call our office at least prior to coming in due to situation, again reiterated the extreme importance of this testing, pt daughter understood and will try again on friday

## 2012-02-21 NOTE — Assessment & Plan Note (Signed)
Chronic problem.  Well controlled.  Currently asymptomatic but pt w/ recent NSTEMI.  No changes in meds at this time.

## 2012-02-21 NOTE — Assessment & Plan Note (Signed)
Chronic problem.  Recent labs show excellent control on statin.  Discussed w/ daughter that she should continue this in light of recent NSTEMI.  Daughter in agreement.

## 2012-02-21 NOTE — Assessment & Plan Note (Signed)
New.  Family decided not to pursue invasive cardiac testing.  Pt still needs cards f/u.  Family to schedule this.  Goal is medical management of risk factors.  BP and lipids look great.  Will need to keep Hgb >9 to avoid cardiac strain.

## 2012-02-21 NOTE — Assessment & Plan Note (Signed)
New.  S/p 2 units PRBCs  Pt is having blood loss anemia but family is in agreement that they do not want to pursue w/u at this time.  Will continue to follow pt's h/h and transfuse prn.  Daughter is in agreement w/ this plan.

## 2012-02-24 ENCOUNTER — Other Ambulatory Visit: Payer: Medicare Other

## 2012-02-24 DIAGNOSIS — R739 Hyperglycemia, unspecified: Secondary | ICD-10-CM

## 2012-02-24 DIAGNOSIS — D649 Anemia, unspecified: Secondary | ICD-10-CM

## 2012-02-25 LAB — HEMOGLOBIN A1C: Hgb A1c MFr Bld: 5.1 % (ref <5.7)

## 2012-03-09 ENCOUNTER — Other Ambulatory Visit: Payer: Medicare Other

## 2012-03-09 DIAGNOSIS — D649 Anemia, unspecified: Secondary | ICD-10-CM

## 2012-03-09 LAB — CBC WITH DIFFERENTIAL/PLATELET
Basophils Relative: 0 % (ref 0–1)
Eosinophils Absolute: 0.1 10*3/uL (ref 0.0–0.7)
Lymphs Abs: 1.7 10*3/uL (ref 0.7–4.0)
MCH: 25.4 pg — ABNORMAL LOW (ref 26.0–34.0)
Neutrophils Relative %: 53 % (ref 43–77)
Platelets: 205 10*3/uL (ref 150–400)
RBC: 4.02 MIL/uL (ref 3.87–5.11)

## 2012-03-21 ENCOUNTER — Ambulatory Visit: Payer: Medicare Other | Admitting: Internal Medicine

## 2012-04-06 ENCOUNTER — Ambulatory Visit (INDEPENDENT_AMBULATORY_CARE_PROVIDER_SITE_OTHER): Payer: Medicare Other | Admitting: Internal Medicine

## 2012-04-06 ENCOUNTER — Encounter: Payer: Self-pay | Admitting: Internal Medicine

## 2012-04-06 VITALS — BP 134/74 | HR 80 | Ht 61.0 in | Wt 145.4 lb

## 2012-04-06 DIAGNOSIS — I10 Essential (primary) hypertension: Secondary | ICD-10-CM

## 2012-04-06 DIAGNOSIS — E785 Hyperlipidemia, unspecified: Secondary | ICD-10-CM

## 2012-04-06 DIAGNOSIS — I214 Non-ST elevation (NSTEMI) myocardial infarction: Secondary | ICD-10-CM

## 2012-04-06 NOTE — Patient Instructions (Signed)
Your physician wants you to follow-up in: 12 months with Dr. Taylor. You will receive a reminder letter in the mail two months in advance. If you don't receive a letter, please call our office to schedule the follow-up appointment.    

## 2012-04-11 ENCOUNTER — Encounter: Payer: Self-pay | Admitting: Internal Medicine

## 2012-04-11 NOTE — Progress Notes (Signed)
HPI Mrs. Kneisel returns today for followup. She is a very pleasant 78 year old woman with coronary artery disease, hypertension, dyslipidemia, and anemia. She was recently hospitalized and sustained a non-ST elevation MI. Because of her advanced age and other comorbidities, additional invasive testing was not prescribed. The patient presents for return. She denies chest pain or shortness of breath. No syncope. She has mild peripheral edema. Allergies  Allergen Reactions  . Ativan (Lorazepam)     Made her hyper  . Haldol (Haloperidol Lactate)     Made her hyper     Current Outpatient Prescriptions  Medication Sig Dispense Refill  . aspirin 81 MG tablet Take 81 mg by mouth 2 (two) times daily.       Marland Kitchen atorvastatin (LIPITOR) 10 MG tablet Take 1 tablet (10 mg total) by mouth daily at 6 PM.  90 tablet  3  . calcium-vitamin D (OSCAL WITH D) 500-200 MG-UNIT per tablet Take 1 tablet by mouth 2 (two) times daily.        . Iron TABS Take 45 mg by mouth daily.      Marland Kitchen levothyroxine (SYNTHROID) 75 MCG tablet Take 1 tablet (75 mcg total) by mouth daily.  90 tablet  3  . lisinopril-hydrochlorothiazide (ZESTORETIC) 20-12.5 MG per tablet Take 1 tablet by mouth daily.  90 tablet  3  . metoprolol (LOPRESSOR) 50 MG tablet Take 1 tablet (50 mg total) by mouth 2 (two) times daily.  180 tablet  0  . potassium chloride SA (K-DUR,KLOR-CON) 20 MEQ tablet Take 1 tablet (20 mEq total) by mouth daily.  90 tablet  3     Past Medical History  Diagnosis Date  . Hyperlipidemia   . Hypertension   . Dementia   . Thyroid disease     Hypothyroidism  . Hearing loss   . Myocardial infarction     ROS:   All systems reviewed and negative except as noted in the HPI. Poor memory   Past Surgical History  Procedure Date  . No past surgeries      Family History  Problem Relation Age of Onset  . Cancer Sister     breast cancer     History   Social History  . Marital Status: Married    Spouse Name: N/A    Number of Children: N/A  . Years of Education: N/A   Occupational History  . Not on file.   Social History Main Topics  . Smoking status: Never Smoker   . Smokeless tobacco: Never Used  . Alcohol Use: No  . Drug Use: No  . Sexually Active:    Other Topics Concern  . Not on file   Social History Narrative  . No narrative on file     BP 134/74  Pulse 80  Ht 5\' 1"  (1.549 m)  Wt 145 lb 6.4 oz (65.953 kg)  BMI 27.47 kg/m2  Physical Exam:  Well appearing elderly woman, NAD HEENT: Unremarkable Neck:  No JVD, no thyromegally Lungs:  Clear with no wheezes, rales, or rhonchi. HEART: IRegular rate rhythm, no murmurs, no rubs, no clicks Abd:  soft, positive bowel sounds, no organomegally, no rebound, no guarding Ext:  2 plus pulses, no edema, no cyanosis, no clubbing Skin:  No rashes no nodules Neuro:  CN II through XII intact, motor grossly intact  EKG Normal sinus rhythm with occasional PVC   Assess/Plan:

## 2012-04-11 NOTE — Assessment & Plan Note (Signed)
I would recommend that she continue taking her atorvastatin. A low-fat diet is recommended.

## 2012-04-11 NOTE — Assessment & Plan Note (Signed)
Her blood pressure today is reasonably well controlled. She will continue a low-sodium diet.

## 2012-04-11 NOTE — Assessment & Plan Note (Signed)
She has not had additional anginal symptoms. For now recommend that she continue her current medical therapy. No invasive evaluation is recommended at this time.

## 2012-04-16 ENCOUNTER — Telehealth: Payer: Self-pay | Admitting: Family Medicine

## 2012-04-16 NOTE — Telephone Encounter (Signed)
refill linsinpril - hctz 20-12.5mg  tab #90, take one tablet by mouth every day -- last fill 8.10.13 Last ov 7.8.13 hospital follow up

## 2012-04-17 NOTE — Telephone Encounter (Signed)
Called the pharmacy to see if refills were on file.  Pt has medication on file since July.  Pharmacy will ready the prescription.

## 2012-04-23 ENCOUNTER — Telehealth: Payer: Self-pay | Admitting: Family Medicine

## 2012-04-23 MED ORDER — LISINOPRIL-HYDROCHLOROTHIAZIDE 20-12.5 MG PO TABS: 1.0000 | ORAL_TABLET | Freq: Every day | ORAL | Status: DC

## 2012-04-23 NOTE — Telephone Encounter (Signed)
LISINOPRIL - HCTZ 20-12.5 MG TABLET QTY:90 LAST REFILL: 03/10/12 TAKE 1 TABLET BY MOUTH EVERYDAY ===NEEDS OV===

## 2012-07-12 ENCOUNTER — Other Ambulatory Visit: Payer: Self-pay | Admitting: *Deleted

## 2012-07-16 ENCOUNTER — Telehealth: Payer: Self-pay | Admitting: Family Medicine

## 2012-07-16 DIAGNOSIS — I10 Essential (primary) hypertension: Secondary | ICD-10-CM

## 2012-07-16 MED ORDER — METOPROLOL TARTRATE 50 MG PO TABS: 50.0000 mg | ORAL_TABLET | Freq: Two times a day (BID) | ORAL | Status: DC

## 2012-07-16 NOTE — Telephone Encounter (Signed)
Refill: Metoprolol tartrate 50 mg tab. Take 1 tablet twice a day. Qty 60. Last fill 06-16-12

## 2012-07-16 NOTE — Telephone Encounter (Signed)
Refill for metoprolol sent to pharmacy

## 2012-07-16 NOTE — Telephone Encounter (Signed)
Rx sent 

## 2012-09-15 ENCOUNTER — Other Ambulatory Visit: Payer: Self-pay

## 2012-09-29 ENCOUNTER — Other Ambulatory Visit: Payer: Self-pay | Admitting: Family Medicine

## 2012-11-24 ENCOUNTER — Other Ambulatory Visit: Payer: Self-pay | Admitting: Family Medicine

## 2012-11-26 NOTE — Telephone Encounter (Signed)
Med filled until June. Pt has a CPE scheduled then.

## 2013-01-12 ENCOUNTER — Other Ambulatory Visit: Payer: Self-pay | Admitting: Family Medicine

## 2013-01-14 NOTE — Telephone Encounter (Signed)
Rx sent to the pharmacy by e-script.//AB/CMA 

## 2013-01-17 ENCOUNTER — Encounter: Payer: Self-pay | Admitting: Family Medicine

## 2013-01-17 ENCOUNTER — Ambulatory Visit (INDEPENDENT_AMBULATORY_CARE_PROVIDER_SITE_OTHER): Payer: Medicare Other | Admitting: Family Medicine

## 2013-01-17 VITALS — BP 120/60 | HR 90 | Temp 98.4°F | Ht 62.5 in | Wt 151.8 lb

## 2013-01-17 DIAGNOSIS — E785 Hyperlipidemia, unspecified: Secondary | ICD-10-CM

## 2013-01-17 DIAGNOSIS — I10 Essential (primary) hypertension: Secondary | ICD-10-CM

## 2013-01-17 DIAGNOSIS — Z0000 Encounter for general adult medical examination without abnormal findings: Secondary | ICD-10-CM

## 2013-01-17 DIAGNOSIS — E039 Hypothyroidism, unspecified: Secondary | ICD-10-CM

## 2013-01-17 NOTE — Patient Instructions (Addendum)
Follow up in 6 months to recheck cholesterol and BP We'll notify you of your lab results and make any changes if needed Keep up the good work!  You look great! Call with any questions or concerns Have a good summer!!!

## 2013-01-17 NOTE — Progress Notes (Signed)
  Subjective:    Patient ID: [name], female    DOB: 1923/11/02, 79 y.o.   MRN: [id number]  HPI Here today for CPE.  Risk Factors: HTN- chronic problem, excellent control on Lisinopril HCTZ, metoprolol.  No CP, SOB, HAs, visual changes, edema. Hyperlipidemia- chronic problem, on Lipitor.  No abd pain, N/V, myalgias. Hypothyroid- chronic problem, on synthroid. Physical Activity: no exercise or activity Fall Risk: moderate risk due to dementia but no falls Depression: denies current sxs Hearing: decreased to conversational tones ADL's: independent Cognitive: ongoing dementia, denies excessive memory loss.   Home Safety: pt reports feeling safe at home, lives w/ husband Height, Weight, BMI, Visual Acuity: see vitals, vision corrected to 20/20 w/ glasses Counseling: pt not interested in mammo, DEXA, colonoscopy. Labs Ordered: See A&P Care Plan: See A&P    Review of Systems Patient reports no vision changes, adenopathy,fever, weight change,  persistant/recurrent hoarseness , swallowing issues, chest pain, palpitations, edema, persistant/recurrent cough, hemoptysis, dyspnea (rest/exertional/paroxysmal nocturnal), gastrointestinal bleeding (melena, rectal bleeding), abdominal pain, significant heartburn, bowel changes, GU symptoms (dysuria, hematuria, incontinence), Gyn symptoms (abnormal  bleeding, pain),  syncope, focal weakness, memory loss, numbness & tingling, skin/hair/nail changes, abnormal bruising or bleeding, anxiety, or depression.     Objective:   Physical Exam General Appearance:    Alert, cooperative, no distress, appears stated age  Head:    Normocephalic, without obvious abnormality, atraumatic  Eyes:    PERRL, conjunctiva/corneas clear, EOM's intact, fundi    benign, both eyes  Ears:    Normal TM's and external ear canals, both ears  Nose:   Nares normal, septum midline, mucosa normal, no drainage    or sinus tenderness  Throat:   Lips, mucosa, and tongue normal;  teeth and gums normal  Neck:   Supple, symmetrical, trachea midline, no adenopathy;    Thyroid: no enlargement/tenderness/nodules  Back:     Symmetric, no curvature, ROM normal, no CVA tenderness  Lungs:     Clear to auscultation bilaterally, respirations unlabored  Chest Wall:    No tenderness or deformity   Heart:    Regular rate and rhythm, S1 and S2 normal, III/VI SEM heard best over RUSB, no rub or gallop  Breast Exam:    Deferred at pt's request  Abdomen:     Soft, non-tender, bowel sounds active all four quadrants,    no masses, no organomegaly  Genitalia:    Deferred  Rectal:    Extremities:   Extremities normal, atraumatic, no cyanosis or edema  Pulses:   2+ and symmetric all extremities  Skin:   Skin color, texture, turgor normal, no rashes or lesions  Lymph nodes:   Cervical, supraclavicular, and axillary nodes normal  Neurologic:   CNII-XII intact, normal strength, sensation and reflexes    throughout          Assessment & Plan:

## 2013-01-18 LAB — BASIC METABOLIC PANEL
CO2: 27 mEq/L (ref 19–32)
Chloride: 98 mEq/L (ref 96–112)
Glucose, Bld: 240 mg/dL — ABNORMAL HIGH (ref 70–99)
Potassium: 3.3 mEq/L — ABNORMAL LOW (ref 3.5–5.1)
Sodium: 136 mEq/L (ref 135–145)

## 2013-01-18 LAB — HEPATIC FUNCTION PANEL
AST: 27 U/L (ref 0–37)
Albumin: 4.1 g/dL (ref 3.5–5.2)
Alkaline Phosphatase: 48 U/L (ref 39–117)
Total Protein: 6.9 g/dL (ref 6.0–8.3)

## 2013-01-18 LAB — CBC WITH DIFFERENTIAL/PLATELET
Eosinophils Relative: 2.6 % (ref 0.0–5.0)
HCT: 36.6 % (ref 36.0–46.0)
Lymphocytes Relative: 41.5 % (ref 12.0–46.0)
Monocytes Relative: 9.8 % (ref 3.0–12.0)
Neutrophils Relative %: 45.7 % (ref 43.0–77.0)
Platelets: 150 10*3/uL (ref 150.0–400.0)
WBC: 4.6 10*3/uL (ref 4.5–10.5)

## 2013-01-18 LAB — LIPID PANEL
LDL Cholesterol: 29 mg/dL (ref 0–99)
Total CHOL/HDL Ratio: 2

## 2013-01-18 NOTE — Assessment & Plan Note (Signed)
Pt's PE WNL w/ exception of known murmur of aortic stenosis.  Pt and family are declining all healthy maintenance at this time.  Check labs.  Anticipatory guidance provided.

## 2013-01-18 NOTE — Assessment & Plan Note (Signed)
Chronic problem.  Tolerating med w/out difficulty.  Asymptomatic.  Check labs.  Adjust meds prn

## 2013-01-18 NOTE — Assessment & Plan Note (Signed)
Chronic problem.  Tolerating meds w/out difficulty.  Check labs.  Adjust meds prn  

## 2013-01-18 NOTE — Assessment & Plan Note (Signed)
Chronic problem.  Well controlled today.  Asymptomatic.  Check labs.  No anticipated med changes. 

## 2013-01-21 ENCOUNTER — Ambulatory Visit: Payer: Medicare Other

## 2013-01-21 DIAGNOSIS — R7309 Other abnormal glucose: Secondary | ICD-10-CM

## 2013-01-21 LAB — HEMOGLOBIN A1C: Hgb A1c MFr Bld: 6.5 % (ref 4.6–6.5)

## 2013-01-26 ENCOUNTER — Other Ambulatory Visit: Payer: Self-pay | Admitting: Family Medicine

## 2013-01-29 ENCOUNTER — Other Ambulatory Visit: Payer: Self-pay | Admitting: Family Medicine

## 2013-01-30 ENCOUNTER — Other Ambulatory Visit: Payer: Self-pay | Admitting: Family Medicine

## 2013-01-31 NOTE — Telephone Encounter (Signed)
Spoke with pharmacy, they received refill sent in on 01/29/13. Refill request sent in error.

## 2013-02-21 ENCOUNTER — Telehealth: Payer: Self-pay

## 2013-02-21 NOTE — Telephone Encounter (Signed)
Msg from daughter Cordelia Pen who has questions about letter that was sent.  Msg left to call the office.       KP

## 2013-02-22 NOTE — Telephone Encounter (Signed)
Spoke with husband and he said Cordelia Pen is on her way to [facility] and would a call at 11:30 on her cell at [phone]     [facility]

## 2013-02-22 NOTE — Telephone Encounter (Signed)
msg left to call the office     KP 

## 2013-02-23 ENCOUNTER — Other Ambulatory Visit: Payer: Self-pay | Admitting: Family Medicine

## 2013-02-25 NOTE — Telephone Encounter (Signed)
Patient's daughter is calling back. Has questions about letter that was sent to her.

## 2013-02-26 ENCOUNTER — Telehealth: Payer: Self-pay

## 2013-02-26 NOTE — Telephone Encounter (Signed)
Rx sent to the pharmacy by e-script.//AB/CMA 

## 2013-02-26 NOTE — Telephone Encounter (Signed)
Spoke with daughter, Clydie Braun who is not on [place] release. Advised patient's daughter that she would need to be put on her mother's list so that we would be allowed to speak with her in the future. Gave information about glucose and A1C. No specific information about the patient. She was satisfied with the information given and will have her parents put her on the list.

## 2013-02-26 NOTE — Telephone Encounter (Signed)
See telephone encounter for (02-26-13).//AB/CMA

## 2013-03-06 ENCOUNTER — Other Ambulatory Visit: Payer: Self-pay

## 2013-03-23 ENCOUNTER — Other Ambulatory Visit: Payer: Self-pay | Admitting: Family Medicine

## 2013-03-26 NOTE — Telephone Encounter (Signed)
Med filled.  

## 2013-04-23 ENCOUNTER — Encounter: Payer: Self-pay | Admitting: *Deleted

## 2013-05-09 ENCOUNTER — Other Ambulatory Visit: Payer: Self-pay | Admitting: Family Medicine

## 2013-05-09 NOTE — Telephone Encounter (Signed)
Med filled.  

## 2013-07-06 ENCOUNTER — Other Ambulatory Visit: Payer: Self-pay | Admitting: Family Medicine

## 2013-07-08 NOTE — Telephone Encounter (Signed)
Rx sent to the pharmacy by e-script.  Note pt needs office visit.//AB/CMA

## 2013-08-05 ENCOUNTER — Telehealth: Payer: Self-pay | Admitting: *Deleted

## 2013-08-05 DIAGNOSIS — F0390 Unspecified dementia without behavioral disturbance: Secondary | ICD-10-CM

## 2013-08-05 NOTE — Telephone Encounter (Signed)
[facility] for neuro referral, [facility]- dx dementia

## 2013-08-05 NOTE — Telephone Encounter (Signed)
Referral placed and patient's daughter notified. 

## 2013-08-05 NOTE — Telephone Encounter (Signed)
Patient daughter called to see if dr Birdie Riddle could place a referral for her mother to [facility] @ [facility]. Daughter states that dr Birdie Riddle is aware of her mother having dementia and wanted to go ahead and see a specialist.

## 2013-08-05 NOTE — Telephone Encounter (Signed)
Is it okay to place?

## 2013-08-17 ENCOUNTER — Other Ambulatory Visit: Payer: Self-pay | Admitting: Family Medicine

## 2013-09-04 ENCOUNTER — Encounter: Payer: Self-pay | Admitting: Cardiology

## 2013-09-07 ENCOUNTER — Other Ambulatory Visit: Payer: Self-pay | Admitting: Family Medicine

## 2013-09-09 NOTE — Telephone Encounter (Signed)
Med filled.  

## 2013-10-01 ENCOUNTER — Telehealth: Payer: Self-pay | Admitting: Family Medicine

## 2013-10-01 NOTE — Telephone Encounter (Signed)
Patient daughter [name]) called and stated that they have left nemerous of messages to refill her mother's metoprolol (LOPRESSOR) 50 MG tablet and no response. Patient's daughter [name]) also demanded that we call her sister Judeen Hammans and talk to her regarding this matter. Please asvise

## 2013-10-01 NOTE — Telephone Encounter (Signed)
Metoprolol prescription refill was sent to [facility] on [address] on 2/7.  Pharmacy confirmed receipt on 2/9.  [facility] was called to confirm receipt.  Pharmacy stated that they received the prescription and that the prescription was picked up on 2/21.  Judeen Hammans, patient's daughter, was notified and made aware of the same.  Judeen Hammans verified that they had received the medication and she was unaware of who may have picked it up.  Thought that maybe her niece picked it up. Judeen Hammans was reminded that the prescription was only for a month's supply and that the patient will need a office visit.  When asked if she would like to go ahead and schedule the visit, Judeen Hammans stated that mom was currently living with her in [place] and would have to establish care there.  No further question or concerns were voiced.  Judeen Hammans did apologize for the misunderstanding.  I apologized on the behalf on [place] for our phone issues and not getting in touch with family sooner.

## 2013-10-08 IMAGING — CR DG CHEST 2V
2 series · 2 of 2 positions shown · non-contrast
Comparison: 03/13/2006.

CLINICAL DATA: 88-year-old female with chest palpitations.
Hypertension.

CHEST - 2 VIEW

[w chest pa]
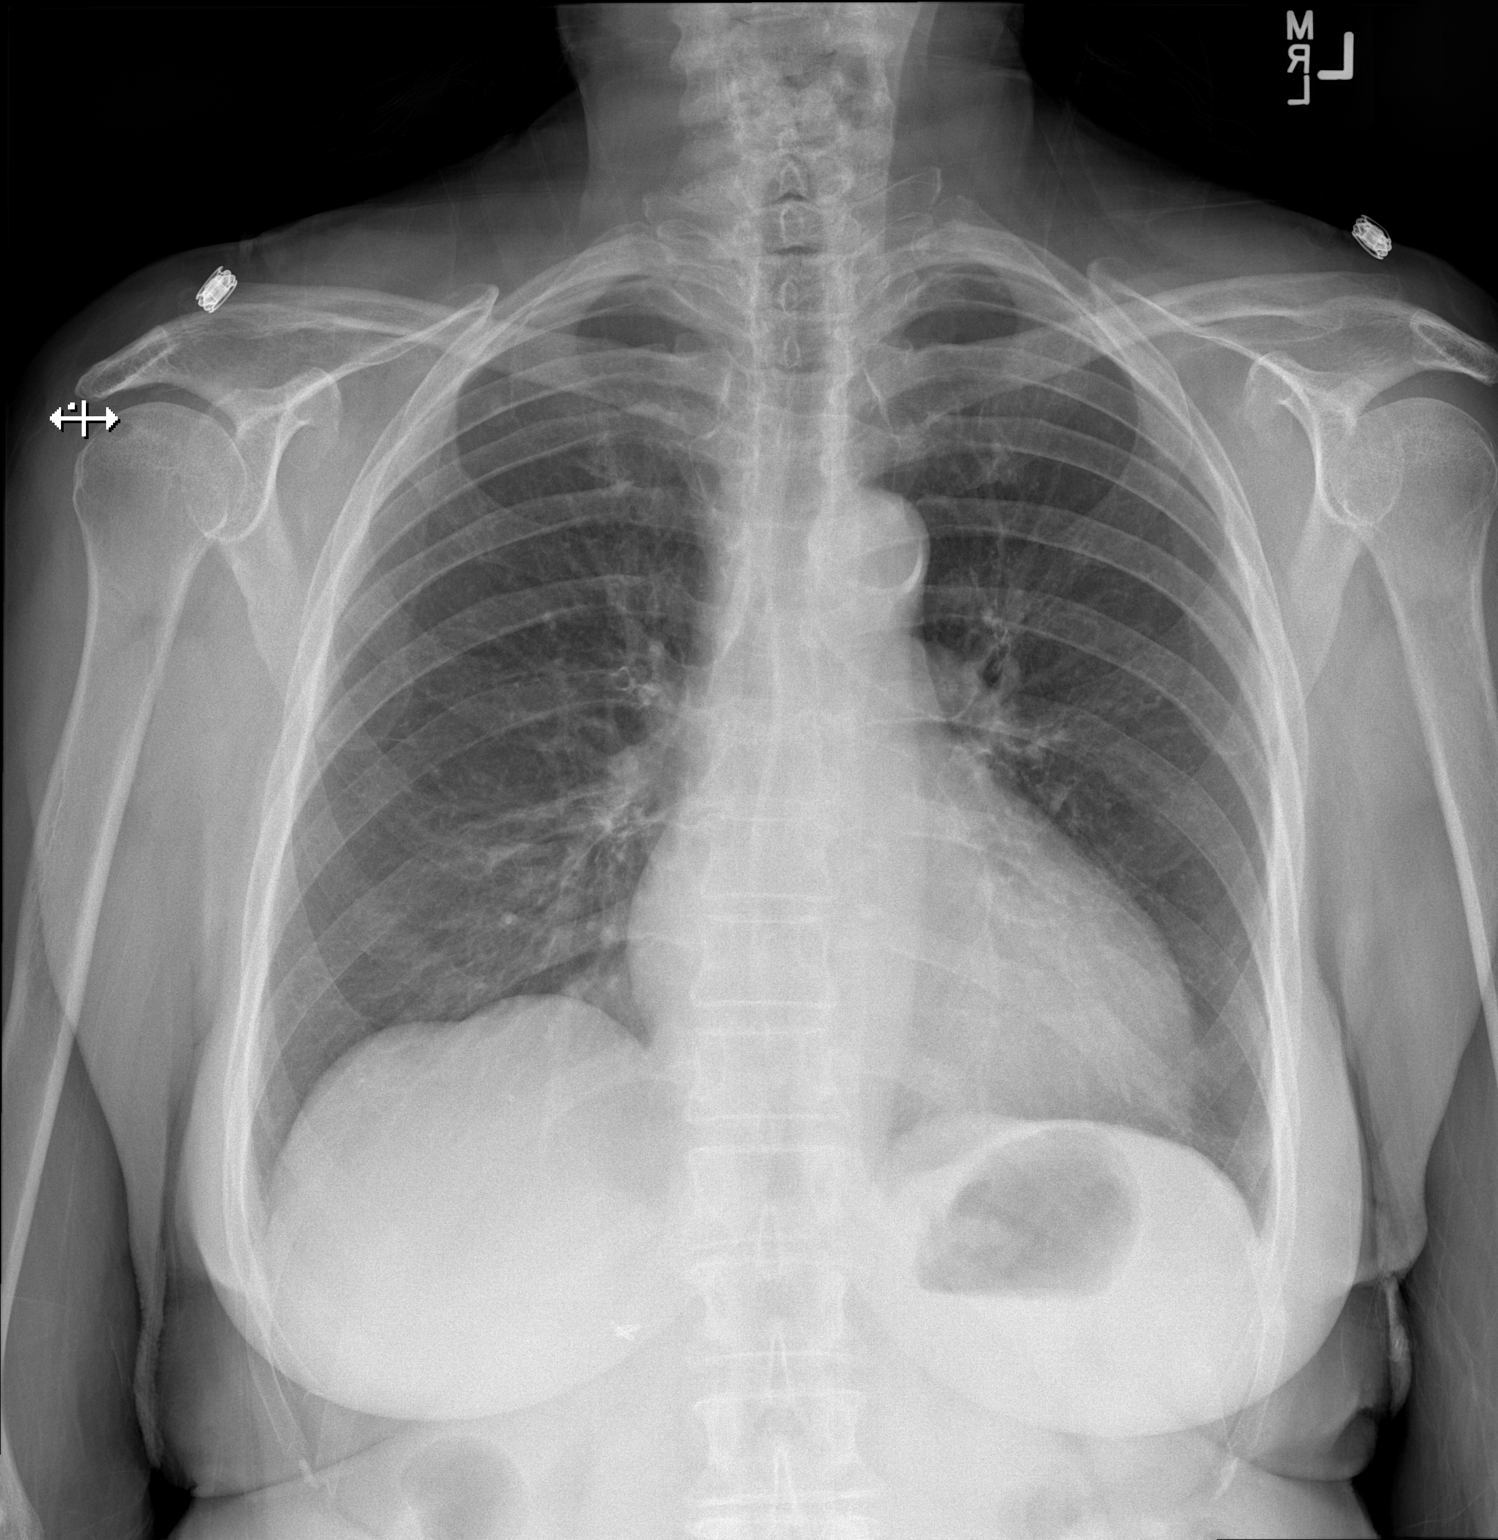

[w chest lat]
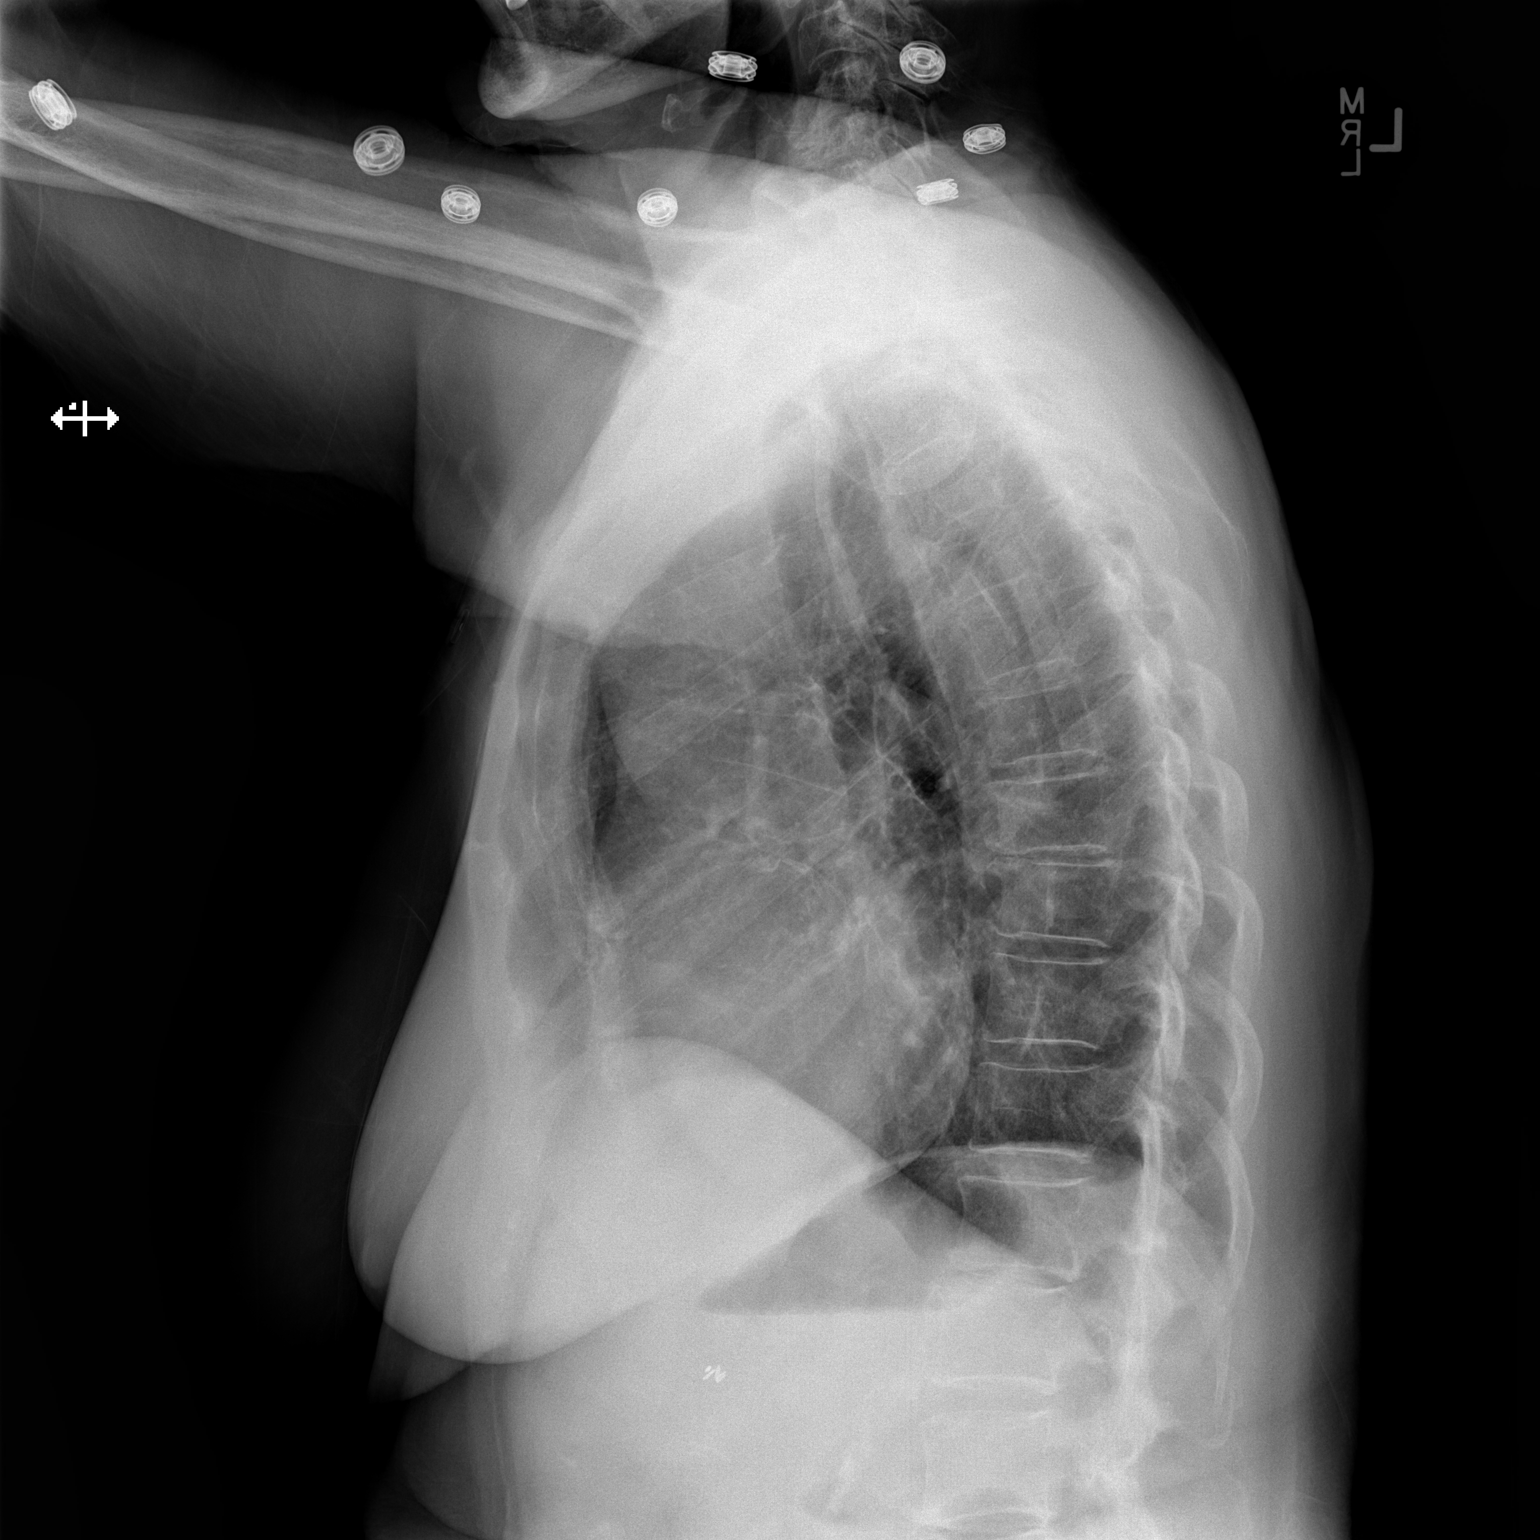

[2 of 2 positions shown; findings below may reference images not displayed]

FINDINGS: Mild to moderate cardiomegaly is increased. Other
mediastinal contours are within normal limits.  Visualized tracheal
air column is within normal limits.  EKG button artifact in the
right apex.  The lungs are clear.  No pneumothorax or effusion.
Mild eventration of the right hemidiaphragm.  Right upper quadrant
surgical clips. No acute osseous abnormality identified.
IMPRESSION: Cardiomegaly. No acute cardiopulmonary abnormality.

## 2015-07-02 ENCOUNTER — Encounter: Payer: Self-pay | Admitting: General Practice

## 2015-07-02 ENCOUNTER — Telehealth: Payer: Self-pay | Admitting: Family Medicine

## 2015-07-02 NOTE — Telephone Encounter (Signed)
Ok to provide letter indicating that pt had dementia since 2011 (which I believe was our first visit- her diagnosis may have predated this)

## 2015-07-02 NOTE — Telephone Encounter (Signed)
Caller name: Santiago Glad  Relationship to patient: Daughter  Can be reached: [phone] or [phone]   Reason for call: Daughter Santiago Glad) called on behalf of patient requesting that Dr. Darene Lamer write a letter for her (daughter) stating how long patient has had Alzheimer's . Daughter requesting letter be sent to her because patient no longer lives in this area.

## 2015-07-02 NOTE — Telephone Encounter (Signed)
Please advise pt has not been seen since 2014. I see in the problem list that we have memory loss listed since 2011 and dementia since (Dr. Stanford Breed placed in problem list)  2011. Pt was referred to neurology on 08/05/13 (Dr. Lawana Pai neuro).

## 2015-07-03 NOTE — Telephone Encounter (Signed)
Pt daughter returned call and advised she will pick up at front desk.

## 2015-07-03 NOTE — Telephone Encounter (Signed)
Called pt daughter and LMOVM to find out if she wants me to place letter at front desk or mail letter?

## 2017-04-01 DEATH — deceased
